# Patient Record
Sex: Female | Born: 1935 | Hispanic: No | Smoking: Former smoker
Health system: Southern US, Community
[De-identification: ages and names within clinical notes are randomized; demographics above are authoritative.]

## PROBLEM LIST (undated history)

## (undated) DIAGNOSIS — T4145XA Adverse effect of unspecified anesthetic, initial encounter: Secondary | ICD-10-CM

## (undated) DIAGNOSIS — C801 Malignant (primary) neoplasm, unspecified: Secondary | ICD-10-CM

## (undated) DIAGNOSIS — Z923 Personal history of irradiation: Secondary | ICD-10-CM

## (undated) DIAGNOSIS — D649 Anemia, unspecified: Secondary | ICD-10-CM

## (undated) DIAGNOSIS — T8859XA Other complications of anesthesia, initial encounter: Secondary | ICD-10-CM

## (undated) HISTORY — PX: TONSILLECTOMY: SUR1361

## (undated) HISTORY — PX: BREAST LUMPECTOMY: SHX2

## (undated) HISTORY — DX: Personal history of irradiation: Z92.3

---

## 1959-01-10 DIAGNOSIS — D649 Anemia, unspecified: Secondary | ICD-10-CM

## 1959-01-10 HISTORY — DX: Anemia, unspecified: D64.9

## 1959-01-10 HISTORY — PX: APPENDECTOMY: SHX54

## 1999-08-06 ENCOUNTER — Encounter: Payer: Self-pay | Admitting: Obstetrics and Gynecology

## 1999-08-06 ENCOUNTER — Encounter: Admission: RE | Admit: 1999-08-06 | Discharge: 1999-08-06 | Payer: Self-pay | Admitting: Obstetrics and Gynecology

## 2000-09-06 ENCOUNTER — Encounter: Payer: Self-pay | Admitting: Obstetrics and Gynecology

## 2000-09-06 ENCOUNTER — Encounter: Admission: RE | Admit: 2000-09-06 | Discharge: 2000-09-06 | Payer: Self-pay | Admitting: Obstetrics and Gynecology

## 2001-09-09 ENCOUNTER — Encounter: Admission: RE | Admit: 2001-09-09 | Discharge: 2001-09-09 | Payer: Self-pay | Admitting: Obstetrics and Gynecology

## 2001-09-09 ENCOUNTER — Encounter: Payer: Self-pay | Admitting: Obstetrics and Gynecology

## 2002-09-25 ENCOUNTER — Encounter: Payer: Self-pay | Admitting: Obstetrics and Gynecology

## 2002-09-25 ENCOUNTER — Encounter: Admission: RE | Admit: 2002-09-25 | Discharge: 2002-09-25 | Payer: Self-pay | Admitting: Obstetrics and Gynecology

## 2003-10-01 ENCOUNTER — Encounter: Admission: RE | Admit: 2003-10-01 | Discharge: 2003-10-01 | Payer: Self-pay | Admitting: Obstetrics and Gynecology

## 2003-11-08 ENCOUNTER — Ambulatory Visit (HOSPITAL_COMMUNITY): Admission: RE | Admit: 2003-11-08 | Discharge: 2003-11-08 | Payer: Self-pay | Admitting: Family Medicine

## 2004-10-16 ENCOUNTER — Encounter: Admission: RE | Admit: 2004-10-16 | Discharge: 2004-10-16 | Payer: Self-pay | Admitting: Obstetrics and Gynecology

## 2005-10-26 ENCOUNTER — Encounter: Admission: RE | Admit: 2005-10-26 | Discharge: 2005-10-26 | Payer: Self-pay | Admitting: Obstetrics and Gynecology

## 2006-11-04 ENCOUNTER — Encounter: Admission: RE | Admit: 2006-11-04 | Discharge: 2006-11-04 | Payer: Self-pay | Admitting: Obstetrics and Gynecology

## 2007-05-12 HISTORY — PX: CATARACT EXTRACTION: SUR2

## 2007-12-01 ENCOUNTER — Encounter: Admission: RE | Admit: 2007-12-01 | Discharge: 2007-12-01 | Payer: Self-pay | Admitting: Obstetrics and Gynecology

## 2008-12-21 ENCOUNTER — Encounter: Admission: RE | Admit: 2008-12-21 | Discharge: 2008-12-21 | Payer: Self-pay | Admitting: Family Medicine

## 2010-03-24 ENCOUNTER — Encounter: Admission: RE | Admit: 2010-03-24 | Discharge: 2010-03-24 | Payer: Self-pay | Admitting: Family Medicine

## 2013-01-25 ENCOUNTER — Other Ambulatory Visit: Payer: Self-pay | Admitting: Obstetrics & Gynecology

## 2013-01-27 ENCOUNTER — Encounter (HOSPITAL_COMMUNITY)
Admission: RE | Admit: 2013-01-27 | Discharge: 2013-01-27 | Disposition: A | Discharge status: Home / Self Care | Payer: Medicare Other | Source: Ambulatory Visit | Attending: Obstetrics & Gynecology | Admitting: Obstetrics & Gynecology

## 2013-01-27 ENCOUNTER — Encounter (HOSPITAL_COMMUNITY): Payer: Self-pay

## 2013-01-27 DIAGNOSIS — Z01818 Encounter for other preprocedural examination: Secondary | ICD-10-CM | POA: Insufficient documentation

## 2013-01-27 DIAGNOSIS — Z01812 Encounter for preprocedural laboratory examination: Secondary | ICD-10-CM | POA: Insufficient documentation

## 2013-01-27 HISTORY — DX: Adverse effect of unspecified anesthetic, initial encounter: T41.45XA

## 2013-01-27 HISTORY — DX: Other complications of anesthesia, initial encounter: T88.59XA

## 2013-01-27 LAB — CBC
Hemoglobin: 14.6 g/dL (ref 12.0–15.0)
MCH: 30.5 pg (ref 26.0–34.0)
MCV: 86.8 fL (ref 78.0–100.0)
RBC: 4.79 MIL/uL (ref 3.87–5.11)
WBC: 7.8 10*3/uL (ref 4.0–10.5)

## 2013-01-27 NOTE — Pre-Procedure Instructions (Signed)
EKG (normal) of 02/08/12 accepted by Dr Brayton Caves and Dr Rodman Pickle.

## 2013-01-27 NOTE — Patient Instructions (Addendum)
20 Ariya Bohannon [place]ridge  01/27/2013   Your procedure is scheduled on:  02/03/13  Enter through the Main Entrance of [facility] at 10 AM.  Pick up the phone at the desk and dial [phone].   Call this number if you have problems the morning of surgery: [phone]   Remember:   Do not eat food:After Midnight.  Do not drink clear liquids: After Midnight.  Take these medicines the morning of surgery with A SIP OF WATER: NA   Do not wear jewelry, make-up or nail polish.  Do not wear lotions, powders, or perfumes. You may wear deodorant.  Do not shave 48 hours prior to surgery.  Do not bring valuables to the hospital.  [facility] is not   responsible for any belongings or valuables brought to the hospital.  Contacts, dentures or bridgework may not be worn into surgery.  Leave suitcase in the car. After surgery it may be brought to your room.  For patients admitted to the hospital, checkout time is 11:00 AM the day of              discharge.   Patients discharged the day of surgery will not be allowed to drive             home.  Name and phone number of your driver: undecided  Special Instructions:   Shower using CHG 2 nights before surgery and the night before surgery.  If you shower the day of surgery use CHG.  Use special wash - you have one bottle of CHG for all showers.  You should use approximately 1/3 of the bottle for each shower.   Please read over the following fact sheets that you were given:   Surgical Site Infection Prevention

## 2013-02-02 ENCOUNTER — Encounter (HOSPITAL_COMMUNITY): Payer: Self-pay | Admitting: Anesthesiology

## 2013-02-02 ENCOUNTER — Ambulatory Visit (HOSPITAL_COMMUNITY)
Admission: RE | Admit: 2013-02-02 | Discharge: 2013-02-02 | Disposition: A | Discharge status: Home / Self Care | Payer: Medicare Other | Source: Ambulatory Visit | Attending: Obstetrics & Gynecology | Admitting: Obstetrics & Gynecology

## 2013-02-02 ENCOUNTER — Encounter (HOSPITAL_COMMUNITY)
Admission: RE | Disposition: A | Discharge status: Home / Self Care | Payer: Self-pay | Source: Ambulatory Visit | Attending: Obstetrics & Gynecology

## 2013-02-02 ENCOUNTER — Ambulatory Visit (HOSPITAL_COMMUNITY): Payer: Medicare Other | Admitting: Anesthesiology

## 2013-02-02 DIAGNOSIS — N840 Polyp of corpus uteri: Secondary | ICD-10-CM | POA: Insufficient documentation

## 2013-02-02 DIAGNOSIS — N859 Noninflammatory disorder of uterus, unspecified: Secondary | ICD-10-CM | POA: Insufficient documentation

## 2013-02-02 DIAGNOSIS — N950 Postmenopausal bleeding: Secondary | ICD-10-CM | POA: Insufficient documentation

## 2013-02-02 HISTORY — PX: DILITATION & CURRETTAGE/HYSTROSCOPY WITH VERSAPOINT RESECTION: SHX5571

## 2013-02-02 LAB — COMPREHENSIVE METABOLIC PANEL
Alkaline Phosphatase: 82 U/L (ref 39–117)
BUN: 20 mg/dL (ref 6–23)
GFR calc Af Amer: 57 mL/min — ABNORMAL LOW (ref >90)
Glucose, Bld: 100 mg/dL — ABNORMAL HIGH (ref 70–99)
Potassium: 3.8 mEq/L (ref 3.5–5.1)
Total Bilirubin: 0.5 mg/dL (ref 0.3–1.2)
Total Protein: 7 g/dL (ref 6.0–8.3)

## 2013-02-02 SURGERY — DILATATION & CURETTAGE/HYSTEROSCOPY WITH VERSAPOINT RESECTION
Anesthesia: General | Site: Vagina | Wound class: Clean Contaminated

## 2013-02-02 MED ORDER — FENTANYL CITRATE 0.05 MG/ML IJ SOLN: 25.0000 ug | INTRAMUSCULAR | Status: DC | PRN

## 2013-02-02 MED ORDER — CEFAZOLIN SODIUM-DEXTROSE 2-3 GM-% IV SOLR
INTRAVENOUS | Status: AC
  Filled 2013-02-02: qty 50

## 2013-02-02 MED ORDER — LIDOCAINE HCL (CARDIAC) 20 MG/ML IV SOLN
INTRAVENOUS | Status: DC | PRN
  Administered 2013-02-02: 30 mL via INTRAVENOUS

## 2013-02-02 MED ORDER — CHLOROPROCAINE HCL 1 % IJ SOLN
INTRAMUSCULAR | Status: AC
  Filled 2013-02-02: qty 30

## 2013-02-02 MED ORDER — ONDANSETRON HCL 4 MG/2ML IJ SOLN: 4.0000 mg | Freq: Once | INTRAMUSCULAR | Status: DC | PRN

## 2013-02-02 MED ORDER — LACTATED RINGERS IV SOLN
INTRAVENOUS | Status: DC
  Administered 2013-02-02: 07:00:00 via INTRAVENOUS

## 2013-02-02 MED ORDER — GLYCOPYRROLATE 0.2 MG/ML IJ SOLN
INTRAMUSCULAR | Status: DC | PRN
  Administered 2013-02-02: 0.2 mg via INTRAVENOUS

## 2013-02-02 MED ORDER — GLYCOPYRROLATE 0.2 MG/ML IJ SOLN
INTRAMUSCULAR | Status: AC
  Filled 2013-02-02: qty 1

## 2013-02-02 MED ORDER — DIPHENHYDRAMINE HCL 50 MG/ML IJ SOLN
INTRAMUSCULAR | Status: AC
  Filled 2013-02-02: qty 1

## 2013-02-02 MED ORDER — EPHEDRINE SULFATE 50 MG/ML IJ SOLN
INTRAMUSCULAR | Status: AC
  Filled 2013-02-02: qty 1

## 2013-02-02 MED ORDER — CEFAZOLIN SODIUM-DEXTROSE 2-3 GM-% IV SOLR
2.0000 g | INTRAVENOUS | Status: AC
  Administered 2013-02-02: 2 g via INTRAVENOUS

## 2013-02-02 MED ORDER — KETOROLAC TROMETHAMINE 30 MG/ML IJ SOLN: 15.0000 mg | Freq: Once | INTRAMUSCULAR | Status: DC | PRN

## 2013-02-02 MED ORDER — HYDROCODONE-IBUPROFEN 5-200 MG PO TABS: 1.0000 | ORAL_TABLET | Freq: Three times a day (TID) | ORAL | Status: DC | PRN

## 2013-02-02 MED ORDER — FENTANYL CITRATE 0.05 MG/ML IJ SOLN
INTRAMUSCULAR | Status: AC
  Filled 2013-02-02: qty 2

## 2013-02-02 MED ORDER — FENTANYL CITRATE 0.05 MG/ML IJ SOLN
INTRAMUSCULAR | Status: DC | PRN
  Administered 2013-02-02 (×2): 25 ug via INTRAVENOUS
  Administered 2013-02-02: 12.5 ug via INTRAVENOUS

## 2013-02-02 MED ORDER — DIPHENHYDRAMINE HCL 50 MG/ML IJ SOLN
INTRAMUSCULAR | Status: DC | PRN
  Administered 2013-02-02: 12.5 mg via INTRAVENOUS

## 2013-02-02 MED ORDER — CHLOROPROCAINE HCL 1 % IJ SOLN
INTRAMUSCULAR | Status: DC | PRN
  Administered 2013-02-02: 20 mL

## 2013-02-02 MED ORDER — PROPOFOL 10 MG/ML IV BOLUS
INTRAVENOUS | Status: DC | PRN
  Administered 2013-02-02: 80 mg via INTRAVENOUS
  Administered 2013-02-02: 20 mg via INTRAVENOUS

## 2013-02-02 MED ORDER — MEPERIDINE HCL 25 MG/ML IJ SOLN: 6.2500 mg | INTRAMUSCULAR | Status: DC | PRN

## 2013-02-02 MED ORDER — SODIUM CHLORIDE 0.9 % IR SOLN
Status: DC | PRN
  Administered 2013-02-02 (×2): 3000 mL

## 2013-02-02 SURGICAL SUPPLY — 15 items
CANISTER SUCTION 2500CC (MISCELLANEOUS) ×2 IMPLANT
CATH ROBINSON RED A/P 16FR (CATHETERS) ×2 IMPLANT
CLOTH BEACON ORANGE TIMEOUT ST (SAFETY) ×2 IMPLANT
CONTAINER PREFILL 10% NBF 60ML (FORM) ×2 IMPLANT
DRESSING TELFA 8X3 (GAUZE/BANDAGES/DRESSINGS) ×2 IMPLANT
ELECTRODE ROLLER VERSAPOINT (ELECTRODE) ×2 IMPLANT
ELECTRODE RT ANGLE VERSAPOINT (CUTTING LOOP) IMPLANT
GLOVE BIO SURGEON STRL SZ 6.5 (GLOVE) ×2 IMPLANT
GLOVE BIOGEL PI IND STRL 7.0 (GLOVE) ×2 IMPLANT
GLOVE BIOGEL PI INDICATOR 7.0 (GLOVE) ×2
GOWN STRL REIN XL XLG (GOWN DISPOSABLE) ×4 IMPLANT
PACK HYSTEROSCOPY LF (CUSTOM PROCEDURE TRAY) ×2 IMPLANT
PAD OB MATERNITY 4.3X12.25 (PERSONAL CARE ITEMS) ×2 IMPLANT
TOWEL OR 17X24 6PK STRL BLUE (TOWEL DISPOSABLE) ×4 IMPLANT
WATER STERILE IRR 1000ML POUR (IV SOLUTION) ×2 IMPLANT

## 2013-02-02 NOTE — Transfer of Care (Signed)
Immediate Anesthesia Transfer of Care Note  Patient: Jasmine Cooper  Procedure(s) Performed: Procedure(s): DILATATION & CURETTAGE/HYSTEROSCOPY WITH VERSAPOINT RESECTION (N/A)  Patient Location: PACU  Anesthesia Type:General  Level of Consciousness: awake, sedated and patient cooperative  Airway & Oxygen Therapy: Patient Spontanous Breathing and Patient connected to nasal cannula oxygen  Post-op Assessment: Report given to PACU RN and Post -op Vital signs reviewed and stable  Post vital signs: Reviewed and stable  Complications: No apparent anesthesia complications

## 2013-02-02 NOTE — Anesthesia Preprocedure Evaluation (Addendum)
Anesthesia Evaluation  Patient identified by MRN, date of birth, ID band Patient awake    Reviewed: Allergy & Precautions, H&P , Patient's Chart, lab work & pertinent test results, reviewed documented beta blocker date and time   Airway Mallampati: II TM Distance: >3 FB Neck ROM: full    Dental no notable dental hx.    Pulmonary  breath sounds clear to auscultation  Pulmonary exam normal       Cardiovascular Rhythm:regular Rate:Normal     Neuro/Psych    GI/Hepatic   Endo/Other    Renal/GU      Musculoskeletal   Abdominal   Peds  Hematology   Anesthesia Other Findings   Reproductive/Obstetrics                           Anesthesia Physical Anesthesia Plan  ASA: II  Anesthesia Plan:    Post-op Pain Management:    Induction: Intravenous  Airway Management Planned: LMA  Additional Equipment:   Intra-op Plan:   Post-operative Plan:   Informed Consent: I have reviewed the patients History and Physical, chart, labs and discussed the procedure including the risks, benefits and alternatives for the proposed anesthesia with the patient or authorized representative who has indicated his/her understanding and acceptance.   Dental Advisory Given and Dental advisory given  Plan Discussed with: CRNA and Surgeon  Anesthesia Plan Comments:         Anesthesia Quick Evaluation

## 2013-02-02 NOTE — Op Note (Addendum)
02/02/2013  8:53 AM  PATIENT:  Jasmine Cooper  79 y.o. female  PRE-OPERATIVE DIAGNOSIS:  Postmenopausal Bleeding, Metaplasia    POST-OPERATIVE DIAGNOSIS:  Postmenopausal Bleeding, Metaplasia                                                         Multipolypoid, vascular intrauterine lesion   PROCEDURE:  Procedure(s): DILATATION & CURETTAGE/HYSTEROSCOPY WITH VERSAPOINT RESECTION  SURGEON:  Surgeon(s): Genia Del, MD  ASSISTANTS: none   ANESTHESIA:   general  PROCEDURE:  Under general anesthesia with laryngeal mask the patient is in lithotomy position. She is a prepped with Betadine on the suprapubic vulvar and vaginal areas. She is draped as usual. The bladder is catheterized. The vaginal exam reveals an anteverted uterus normal volume and no adnexal mass. The speculum was introduced in the vagina. The anterior lip of the cervix is grasped with a tenaculum. Dilation of the cervix with Hegar dilators up to #27 without difficulty. Diagnostic hysteroscopy performed revealing mostlyan atrophic endometrium with both ostia visualized, but on the anterior lower aspect of the intrauterine cavity, towards the left side, a multi-polypoid vascular lesion is present.  The diagnostic hysteroscope was removed. We further dilate the cervix with Hegar dilators up to #33. We then used the operative hysteroscope with the VersaPoint loop to resect that lesion. We then proceed with a systematic thorough curettage of the intrauterine cavity on all surfaces. Both specimens are sent to pathology. We then go back with the hysteroscope and complete hemostasis with coag mode. Pictures were taken before and after resection. We then removed all instruments.  We confirmed good hemostasis on the cervix and removed the speculum. The patient is brought to recovery room in good and stable status.  ESTIMATED BLOOD LOSS: 50 cc  Fluid deficit 550 cc   Intake/Output Summary (Last 24 hours) at 02/02/13 0853 Last  data filed at 02/02/13 0845  Gross per 24 hour  Intake    900 ml  Output    203 ml  Net    697 ml     BLOOD ADMINISTERED:none   LOCAL MEDICATIONS USED: Nesacaine 1%,  Amount: 20 ml  SPECIMEN:  Source of Specimen:  Endometrial curettings and resection  DISPOSITION OF SPECIMEN:  PATHOLOGY  COUNTS:  YES  PLAN OF CARE: Transfer to PACU  Genia Del MD  02/02/2013 at 8:53 am

## 2013-02-02 NOTE — Discharge Summary (Signed)
  Physician Discharge Summary  Patient ID: Jasmine Cooper MRN: [id number] DOB/AGE: December 31, 1929 79 y.o.  Admit date: 02/02/2013 Discharge date: 02/02/2013  Admission Diagnoses: Postmenopausal Bleeding, Atypical Metaplasia  84696  Discharge Diagnoses: Postmenopausal Bleeding, Atypical Metaplasia  [id number]        Active Problems:   * No active hospital problems. *   Discharged Condition: good  Hospital Course: Outpatient  Consults: None  Treatments: surgery: Hysteroscopy with resection, D+C  Disposition: Final discharge disposition not confirmed     Medication List         hydrocodone-ibuprofen 5-200 MG per tablet  Commonly known as:  VICOPROFEN  Take 1 tablet by mouth every 8 (eight) hours as needed for pain.           Follow-up Information   Follow up with Jasmine Cooper,MARIE-LYNE, MD In 1 week.   Specialty:  Obstetrics and Gynecology   Contact information:   [address] [place] [address] [phone]       Signed: Genia Del, MD 02/02/2013, 9:03 AM

## 2013-02-02 NOTE — Anesthesia Postprocedure Evaluation (Signed)
Anesthesia Post Note  Patient: Jasmine Cooper  Procedure(s) Performed: Procedure(s) (LRB): DILATATION & CURETTAGE/HYSTEROSCOPY WITH VERSAPOINT RESECTION (N/A)  Anesthesia type: General  Patient location: PACU  Post pain: Pain level controlled  Post assessment: Post-op Vital signs reviewed  Last Vitals:  Filed Vitals:   02/02/13 0900  BP: 126/56  Pulse: 52  Temp: 36.7 C  Resp: 20    Post vital signs: Reviewed  Level of consciousness: sedated  Complications: No apparent anesthesia complications

## 2013-02-02 NOTE — H&P (Signed)
Jasmine Cooper is an 79 y.o. female G29P4  RP:  PMB, EBx metaplasia  Pertinent Gynecological History: Menses: post-menopausal Bleeding: post menopausal bleeding Contraception: none Blood transfusions: none Sexually transmitted diseases: no past history Previous GYN Procedures: None  Last mammogram: normal Last pap: normal  OB History: G4P4   Menstrual History:  No LMP recorded.    Past Medical History  Diagnosis Date  . Complication of anesthesia     very sensitive to medication. hard to wake after anes several  yrs ago.  . Medical history non-contributory     Past Surgical History  Procedure Laterality Date  . Appendectomy    . Breast lumpectomy Left     benign cyst    No family history on file.  Social History:  reports that she has quit smoking. She does not have any smokeless tobacco history on file. She reports that she drinks about 0.6 ounces of alcohol per week. She reports that she does not use illicit drugs.  Allergies:  Allergies  Allergen Reactions  . Codeine Nausea And Vomiting  . Morphine And Related Nausea And Vomiting    No prescriptions prior to admission     Blood pressure 142/100, pulse 70, temperature 97.9 F (36.6 C), temperature source Oral, resp. rate 16, SpO2 99.00%.  Pelvic US Thickened endometrium EBx metaplasia  Results for orders placed during the hospital encounter of 02/02/13 (from the past 24 hour(s))  COMPREHENSIVE METABOLIC PANEL     Status: Abnormal   Collection Time    02/02/13  6:15 AM      Result Value Range   Sodium 140  135 - 145 mEq/L   Potassium 3.8  3.5 - 5.1 mEq/L   Chloride 104  96 - 112 mEq/L   CO2 25  19 - 32 mEq/L   Glucose, Bld 100 (*) 70 - 99 mg/dL   BUN 20  6 - 23 mg/dL   Creatinine, Ser 4.09  0.50 - 1.10 mg/dL   Calcium 9.9  8.4 - 81.1 mg/dL   Total Protein 7.0  6.0 - 8.3 g/dL   Albumin 3.6  3.5 - 5.2 g/dL   AST 23  0 - 37 U/L   ALT 16  0 - 35 U/L   Alkaline Phosphatase 82  39 - 117 U/L   Total Bilirubin 0.5  0.3 - 1.2 mg/dL   GFR calc non Af Amer 50 (*) >90 mL/min   GFR calc Af Amer 57 (*) >90 mL/min    No results found.  Assessment/Plan: PMB with metaplasia on EBx, HSC possible resection, D+C.  Surgery and risks reviewed.  Marixa Mellott,MARIE-LYNE 02/02/2013, 7:21 AM

## 2013-02-04 ENCOUNTER — Encounter (HOSPITAL_COMMUNITY): Payer: Self-pay | Admitting: Obstetrics & Gynecology

## 2013-02-07 ENCOUNTER — Telehealth: Payer: Self-pay | Admitting: Gynecologic Oncology

## 2013-02-07 ENCOUNTER — Encounter: Payer: Self-pay | Admitting: Gynecologic Oncology

## 2013-02-07 ENCOUNTER — Ambulatory Visit: Payer: Medicare Other | Attending: Gynecologic Oncology | Admitting: Gynecologic Oncology

## 2013-02-07 VITALS — BP 162/90 | HR 69 | Temp 98.0°F | Resp 16 | Ht 62.4 in | Wt 145.9 lb

## 2013-02-07 DIAGNOSIS — C549 Malignant neoplasm of corpus uteri, unspecified: Secondary | ICD-10-CM | POA: Insufficient documentation

## 2013-02-07 DIAGNOSIS — C541 Malignant neoplasm of endometrium: Secondary | ICD-10-CM

## 2013-02-07 NOTE — Progress Notes (Signed)
Consult Note: Gyn-Onc  Consult was requested by Dr. LaVoie for the evaluation of Jasmine Cooper 79 y.o. female  CC: Endometrial squamous cell cancer.  Assessment/Plan:  This is a lovely 79-year-old who is much younger than her chronological age with an endometrial squamous cell cancer. On physical examination no lesions are identified within the cervix or vagina. On evaluation of the hysteroscopic operative report a lesion in the uterus was clearly identified. This patient is adamant that she would not accept chemotherapy but however would consider radiotherapy. The options presented for management of this presumed uterine squamous cell cancer were either open or minimally invasive hysterectomy with a minimal pelvic lymph node dissection.  There is minimal uterine descensus.  Patient opted for an opened staging procedure and as noted before by Dr. Daniel Clarke Pearson on 02/14/2013 the risks of the procedure discussed with the patient were that of infection bleeding damage to surrounding structures prolonged hospitalization, reoperation, deep venous thrombosis and embolism.   History of present illness: Jasmine Cooper is a 79 y.o. year old G4P4 who presented to Dr. LaVoie with c/o post menopausal bleeding since July 2014. She reports intentional weight loss over the last 2-3 months of approximately 3-4 pounds. Pelvic UTZ was c/w a thickened endometrial stripe measuring 24.4 mm. Uterine length is 6.5 cm the adnexa were without any identifiable abnormalities. An endometrial biopsy was attempted on 01/16/2013 returned evidence of atypical squamous metaplasia Hysteroscopy D&C performed 02/02/2013 by Dr. LaVoie. Findings notable for"on the anterior lower aspect of the intrauterine cavity, towards the left side, a multi-polypoid vascular lesion is present". No mention was made of an abnormal appearing cervix. Endometrial biopsy c/w SCCA.     Current Meds:  Outpatient Encounter Prescriptions as of  02/07/2013  Medication Sig Dispense Refill  . hydrocodone-ibuprofen (VICOPROFEN) 5-200 MG per tablet Take 1 tablet by mouth every 8 (eight) hours as needed for pain.  30 tablet  0   No facility-administered encounter medications on file as of 02/07/2013.    Allergy:  Allergies  Allergen Reactions  . Codeine Nausea And Vomiting  . Morphine And Related Nausea And Vomiting    Social Hx:   History   Social History  . Marital Status: Widowed    Spouse Name: N/A    Number of Children: N/A  . Years of Education: N/A   Occupational History  . Not on file.   Social History Main Topics  . Smoking status: Former Smoker  . Smokeless tobacco: Not on file  . Alcohol Use: 0.6 oz/week    1 Glasses of wine per week     Comment: rarely  . Drug Use: No  . Sexual Activity: Not on file   Other Topics Concern  . Not on file   Social History Narrative  . No narrative on file    Past Surgical Hx:  Past Surgical History  Procedure Laterality Date  . Appendectomy    . Breast lumpectomy Left     benign cyst  . Dilitation & currettage/hystroscopy with versapoint resection N/A 02/02/2013    Procedure: DILATATION & CURETTAGE/HYSTEROSCOPY WITH VERSAPOINT RESECTION;  Surgeon: Marie-Lyne Lavoie, MD;  Location: [facility];  Service: Gynecology;  Laterality: N/A;  . Cataract extraction  2009   Jasmine Cooper  is widowed and has 2 sons who reside in the area. Her daughters reside in [place] and plan to travel back to the area for the operative procedure.  The patient performs all her of her activities of daily living and   uses a Smart phone. She is desirous of a more up to date and current model.  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 Complication of anesthesia     very sensitive to medication. hard to wake after anes several  yrs ago.  . Medical history non-contributory     Past Gynecological History:  Gravida 4 para  4 for normal spontaneous vaginal deliveries menarche at age 14 with regular menses until her early 50s. Denies any history of abnormal Pap tests.  Family Hx:  Family History  Problem Relation Age of Onset  . Cancer Maternal Grandmother   . Breast cancer Daughter     Review of Systems:  Constitutional  Feels well,  Cardiovascular  No chest pain, shortness of breath, or edema  Pulmonary  No cough or wheeze.  Gastro Intestinal  No nausea, vomitting, or diarrhoea. No bright red blood per rectum, no abdominal pain, change in bowel movement, or constipation.  Genito Urinary  No frequency, urgency, dysuria, intermittent vaginal spotting no vaginal discharge no incontinence Musculo Skeletal  No myalgia, arthralgia, joint swelling or pain  Neurologic  No weakness, numbness, change in gait,  Psychology  No depression, anxiety, insomnia. Normal mood and affect appropriate judgment  Vitals:  Blood pressure 162/90, pulse 69, temperature 98 F (36.7 C), resp. rate 16, height 5' 2.4" (1.585 m), weight 145 lb 14.4 oz (66.18 kg).  Physical Exam: WD in NAD appears and acts much younger than her chronological age Neck  Supple NROM, without any enlargements.  Lymph Node Survey No cervical supraclavicular or inguinal adenopathy Cardiovascular  Pulse normal rate, regularity and rhythm. S1 and S2 normal.  Lungs  Clear to auscultation bilateraly, without wheezes/crackles/rhonchi. Good air movement.  Skin  No rash/lesions/breakdown  Psychiatry  Alert and oriented to person, place, and time  Abdomen  Normoactive bowel sounds, abdomen soft, non-tender and obese. Old appendectomy scar without evidence of hernia. No fluid wave no palpable omental cake Back No CVA tenderness Genito Urinary  Vulva/vagina: Normal external female genitalia.  No lesions. No discharge or bleeding.  Bladder/urethra:  No lesions or masses  Vagina: Atrophic without any lesions or masses  Cervix: Normal appearing, no  lesions. Good vaginal support  Uterus: Small, mobile, no parametrial involvement or nodularity.  Adnexa: No palpable masses. No cul de sac nodularity. Rectal  Good tone, no masses no cul de sac nodularity.  Extremities  No bilateral cyanosis, clubbing or edema.   Jasmine Hayne, MD, PhD 02/07/2013, 12:35 PM    

## 2013-02-07 NOTE — Telephone Encounter (Signed)
Consult Note: Gyn-Onc  Consult was requested by Dr. Seymour Bars for the evaluation of Jasmine Cooper 79 y.o. female  CC: SCCA of the endometruium.  Assessment/Plan:  Jasmine Cooper [id number]  is a 79 y.o.    HPI:  Ms.  Cooper is a 79 y.o. year old  G4P4 who presented to Dr. Seymour Bars with c/o post menopausal bleeding.  Pelvic UTZ was c/w a thickened endometrial stripe.  Hysteroscopy D&C performed 02/02/2013 by Dr. Seymour Bars.  Findings notable for"on the anterior lower aspect of the intrauterine cavity, towards the left side, a multi-polypoid vascular lesion is present".  No mention was made of an abnormal appearing cervix.  Endometrial biopsy c/w SCCA.    Interval History:    Current Meds:  Outpatient Encounter Prescriptions as of 02/07/2013  Medication Sig Dispense Refill  . hydrocodone-ibuprofen (VICOPROFEN) 5-200 MG per tablet Take 1 tablet by mouth every 8 (eight) hours as needed for pain.  30 tablet  0   No facility-administered encounter medications on file as of 02/07/2013.    Allergy:  Allergies  Allergen Reactions  . Codeine Nausea And Vomiting  . Morphine And Related Nausea And Vomiting    Social Hx:   History   Social History  . Marital Status: Widowed    Spouse Name: N/A    Number of Children: N/A  . Years of Education: N/A   Occupational History  . Not on file.   Social History Main Topics  . Smoking status: Former Games developer  . Smokeless tobacco: Not on file  . Alcohol Use: 0.6 oz/week    1 Glasses of wine per week     Comment: rarely  . Drug Use: No  . Sexual Activity: Not on file   Other Topics Concern  . Not on file   Social History Narrative  . No narrative on file    Past Surgical Hx:  Past Surgical History  Procedure Laterality Date  . Appendectomy    . Breast lumpectomy Left     benign cyst  . Dilitation & currettage/hystroscopy with versapoint resection N/A 02/02/2013    Procedure: DILATATION & CURETTAGE/HYSTEROSCOPY WITH VERSAPOINT  RESECTION;  Surgeon: Genia Del, MD;  Location: [facility];  Service: Gynecology;  Laterality: N/A;    Past Medical Hx:  Past Medical History  Diagnosis Date  . Complication of anesthesia     very sensitive to medication. hard to wake after anes several  yrs ago.  . Medical history non-contributory     Past Gynecological History:     No LMP recorded. Patient is postmenopausal.  Family Hx: No family history on file.  Review of Systems:  Constitutional  Feels well,    Skin/Breast  No rash, sores, jaundice, itching, dryness Cardiovascular  No chest pain, shortness of breath, or edema  Pulmonary  No cough or wheeze.  Gastro Intestinal  No nausea, vomitting, or diarrhoea. No bright red blood per rectum, no abdominal pain, change in bowel movement, or constipation.  Genito Urinary  No frequency, urgency, dysuria,   Musculo Skeletal  No myalgia, arthralgia, joint swelling or pain  Neurologic  No weakness, numbness, change in gait,  Psychology  No depression, anxiety, insomnia.   Vitals:   Physical Exam: WD in NAD Neck  Supple NROM, without any enlargements.  Lymph Node Survey No cervical supraclavicular or inguinal adenopathy Cardiovascular  Pulse normal rate, regularity and rhythm. S1 and S2 normal.  Lungs  Clear to auscultation bilateraly, without wheezes/crackles/rhonchi. Good air movement.  Skin  No rash/lesions/breakdown  Psychiatry  Alert and oriented to person, place, and time  Abdomen  Normoactive bowel sounds, abdomen soft, non-tender and obese. Surgical  sites intact without evidence of hernia.  Back No CVA tenderness Genito Urinary  Vulva/vagina: Normal external female genitalia.  No lesions. No discharge or bleeding.  Bladder/urethra:  No lesions or masses  Vagina:   Cervix: Normal appearing, no lesions.  Uterus: Small, mobile, no parametrial involvement or nodularity.  Adnexa: No palpable masses. Rectal  Good tone, no masses no cul de sac  nodularity.  Extremities  No bilateral cyanosis, clubbing or edema.   Laurette Schimke, MD, PhD 02/07/2013, 7:31 AM

## 2013-02-08 NOTE — Patient Instructions (Addendum)
20 Jasmine Cooper [id number]  02/08/2013   Your procedure is scheduled on: 02-14-2013  Report to [facility] at 745 AM.  Call this number if you have problems the morning of surgery [phone]   Remember:fleets enema evening Monday 02-13-2013   Do not eat food  :After Midnight Sunday night.              Clear liquids all day Monday 02-13-2013, no liquids after midnight Monday night.      Take these medicines the morning of surgery with A SIP OF WATER: none                                SEE [place] PREPARING FOR SURGERY SHEET             You may not have any metal on your body including hair pins and piercings  Do not wear jewelry, make-up.  Do not wear lotions, powders, or perfumes. You may wear deodorant.   Men may shave face and neck.  Do not bring valuables to the hospital. [facility] IS NOT RESPONSIBLE FOR VALUEABLES.  Contacts, dentures or bridgework may not be worn into surgery.  Leave suitcase in the car. After surgery it may be brought to your room.  For patients admitted to the hospital, checkout time is 11:00 AM the day of discharge.   Patients discharged the day of surgery will not be allowed to drive home.  Name and phone number of your driver:  Special Instructions: N/A   Please read over the following fact sheets that you were given: incentive spirometer sheet, clear liquids sheet, blood fact sheet  Call Cain Sieve RN pre op nurse if needed [phone]    FAILURE TO FOLLOW THESE INSTRUCTIONS MAY RESULT IN THE CANCELLATION OF YOUR SURGERY.  PATIENT SIGNATURE___________________________________________  NURSE SIGNATURE_____________________________________________

## 2013-02-09 ENCOUNTER — Other Ambulatory Visit: Payer: Self-pay | Admitting: Gynecologic Oncology

## 2013-02-09 ENCOUNTER — Encounter (HOSPITAL_COMMUNITY)
Admission: RE | Admit: 2013-02-09 | Discharge: 2013-02-09 | Disposition: A | Discharge status: Home / Self Care | Payer: Medicare Other | Source: Ambulatory Visit | Attending: Gynecology | Admitting: Gynecology

## 2013-02-09 ENCOUNTER — Encounter (HOSPITAL_COMMUNITY): Payer: Self-pay | Admitting: Pharmacy Technician

## 2013-02-09 ENCOUNTER — Ambulatory Visit (HOSPITAL_COMMUNITY)
Admission: RE | Admit: 2013-02-09 | Discharge: 2013-02-09 | Disposition: A | Discharge status: Home / Self Care | Payer: Medicare Other | Source: Ambulatory Visit | Attending: Gynecologic Oncology | Admitting: Gynecologic Oncology

## 2013-02-09 ENCOUNTER — Encounter (HOSPITAL_COMMUNITY): Payer: Self-pay

## 2013-02-09 DIAGNOSIS — C549 Malignant neoplasm of corpus uteri, unspecified: Secondary | ICD-10-CM | POA: Insufficient documentation

## 2013-02-09 DIAGNOSIS — Z01812 Encounter for preprocedural laboratory examination: Secondary | ICD-10-CM | POA: Insufficient documentation

## 2013-02-09 DIAGNOSIS — N390 Urinary tract infection, site not specified: Secondary | ICD-10-CM

## 2013-02-09 HISTORY — DX: Malignant (primary) neoplasm, unspecified: C80.1

## 2013-02-09 HISTORY — DX: Anemia, unspecified: D64.9

## 2013-02-09 LAB — CBC WITH DIFFERENTIAL/PLATELET
Eosinophils Absolute: 0.5 10*3/uL (ref 0.0–0.7)
Eosinophils Relative: 6 % — ABNORMAL HIGH (ref 0–5)
HCT: 43.2 % (ref 36.0–46.0)
Lymphocytes Relative: 24 % (ref 12–46)
Lymphs Abs: 1.9 10*3/uL (ref 0.7–4.0)
MCH: 30.1 pg (ref 26.0–34.0)
MCV: 87.8 fL (ref 78.0–100.0)
Monocytes Absolute: 0.6 10*3/uL (ref 0.1–1.0)
Neutro Abs: 4.8 10*3/uL (ref 1.7–7.7)
Platelets: 249 10*3/uL (ref 150–400)
RBC: 4.92 MIL/uL (ref 3.87–5.11)
RDW: 13.1 % (ref 11.5–15.5)
WBC: 7.7 10*3/uL (ref 4.0–10.5)

## 2013-02-09 LAB — COMPREHENSIVE METABOLIC PANEL
ALT: 16 U/L (ref 0–35)
Albumin: 3.7 g/dL (ref 3.5–5.2)
CO2: 27 mEq/L (ref 19–32)
Calcium: 9.7 mg/dL (ref 8.4–10.5)
Chloride: 101 mEq/L (ref 96–112)
Creatinine, Ser: 0.85 mg/dL (ref 0.50–1.10)
GFR calc Af Amer: 72 mL/min — ABNORMAL LOW (ref >90)
GFR calc non Af Amer: 63 mL/min — ABNORMAL LOW (ref >90)
Glucose, Bld: 92 mg/dL (ref 70–99)
Sodium: 139 mEq/L (ref 135–145)
Total Protein: 7.2 g/dL (ref 6.0–8.3)

## 2013-02-09 LAB — URINE MICROSCOPIC-ADD ON

## 2013-02-09 LAB — ABO/RH: ABO/RH(D): A POS

## 2013-02-09 LAB — URINALYSIS, ROUTINE W REFLEX MICROSCOPIC
Bilirubin Urine: NEGATIVE
Ketones, ur: NEGATIVE mg/dL
Nitrite: POSITIVE — AB
Specific Gravity, Urine: 1.015 (ref 1.005–1.030)
pH: 6 (ref 5.0–8.0)

## 2013-02-09 MED ORDER — SULFAMETHOXAZOLE-TMP DS 800-160 MG PO TABS: 1.0000 | ORAL_TABLET | Freq: Two times a day (BID) | ORAL | Status: DC

## 2013-02-09 NOTE — Progress Notes (Signed)
Patient notified of urinalysis results and the need to take Bactrim DS twice daily for three days prior to surgery per Dr. Nelly Rout.  Verbalizing understanding.  Instructed to call for any needs.  Reportable signs and symptoms reviewed.

## 2013-02-09 NOTE — Progress Notes (Signed)
Urine results routed to [facility] cross rn by epic to inbasket

## 2013-02-11 LAB — URINE CULTURE: Colony Count: >=100000

## 2013-02-13 ENCOUNTER — Telehealth: Payer: Self-pay | Admitting: Gynecologic Oncology

## 2013-02-13 NOTE — Telephone Encounter (Signed)
Spoke with patient about pre-operative status.  She has started taking in clear liquids only this am.  Plans to administer fleets enema this evening.  No concerns voiced.  Instructed to call for any needs.  Verbalizing understanding of instructions for surgery tomorrow morning.

## 2013-02-13 NOTE — Progress Notes (Signed)
Urine culture results faxed to [facility] np

## 2013-02-14 ENCOUNTER — Encounter (HOSPITAL_COMMUNITY): Payer: Self-pay | Admitting: *Deleted

## 2013-02-14 ENCOUNTER — Inpatient Hospital Stay (HOSPITAL_COMMUNITY): Payer: Medicare Other | Admitting: Anesthesiology

## 2013-02-14 ENCOUNTER — Encounter (HOSPITAL_COMMUNITY): Payer: Self-pay | Admitting: Anesthesiology

## 2013-02-14 ENCOUNTER — Encounter (HOSPITAL_COMMUNITY)
Admission: RE | Disposition: A | Discharge status: Home / Self Care | Payer: Self-pay | Source: Ambulatory Visit | Attending: Obstetrics & Gynecology

## 2013-02-14 ENCOUNTER — Inpatient Hospital Stay (HOSPITAL_COMMUNITY)
Admission: RE | Admit: 2013-02-14 | Discharge: 2013-02-16 | DRG: 741 | Disposition: A | Discharge status: Home / Self Care | Payer: Medicare Other | Source: Ambulatory Visit | Attending: Obstetrics & Gynecology | Admitting: Obstetrics & Gynecology

## 2013-02-14 DIAGNOSIS — C549 Malignant neoplasm of corpus uteri, unspecified: Secondary | ICD-10-CM

## 2013-02-14 DIAGNOSIS — Z87891 Personal history of nicotine dependence: Secondary | ICD-10-CM

## 2013-02-14 DIAGNOSIS — N950 Postmenopausal bleeding: Secondary | ICD-10-CM | POA: Diagnosis present

## 2013-02-14 DIAGNOSIS — C541 Malignant neoplasm of endometrium: Secondary | ICD-10-CM | POA: Diagnosis present

## 2013-02-14 HISTORY — PX: LAPAROTOMY: SHX154

## 2013-02-14 HISTORY — PX: ABDOMINAL HYSTERECTOMY: SHX81

## 2013-02-14 HISTORY — PX: SALPINGOOPHORECTOMY: SHX82

## 2013-02-14 LAB — TYPE AND SCREEN: Antibody Screen: NEGATIVE

## 2013-02-14 SURGERY — HYSTERECTOMY, ABDOMINAL
Anesthesia: General | Site: Abdomen | Wound class: Clean Contaminated

## 2013-02-14 MED ORDER — HEPARIN SODIUM (PORCINE) 1000 UNIT/ML IJ SOLN
INTRAMUSCULAR | Status: AC
  Filled 2013-02-14: qty 1

## 2013-02-14 MED ORDER — ONDANSETRON HCL 4 MG/2ML IJ SOLN
INTRAMUSCULAR | Status: DC | PRN
  Administered 2013-02-14: 4 mg via INTRAMUSCULAR

## 2013-02-14 MED ORDER — ONDANSETRON HCL 4 MG PO TABS: 4.0000 mg | ORAL_TABLET | Freq: Four times a day (QID) | ORAL | Status: DC | PRN

## 2013-02-14 MED ORDER — ENSURE COMPLETE PO LIQD
237.0000 mL | Freq: Two times a day (BID) | ORAL | Status: DC
  Administered 2013-02-14 – 2013-02-16 (×3): 237 mL via ORAL

## 2013-02-14 MED ORDER — 0.9 % SODIUM CHLORIDE (POUR BTL) OPTIME
TOPICAL | Status: DC | PRN
  Administered 2013-02-14: 2000 mL

## 2013-02-14 MED ORDER — KETOROLAC TROMETHAMINE 30 MG/ML IJ SOLN: 15.0000 mg | Freq: Four times a day (QID) | INTRAMUSCULAR | Status: AC

## 2013-02-14 MED ORDER — KETOROLAC TROMETHAMINE 30 MG/ML IJ SOLN
15.0000 mg | Freq: Four times a day (QID) | INTRAMUSCULAR | Status: AC
  Administered 2013-02-14 – 2013-02-15 (×4): 15 mg via INTRAVENOUS
  Filled 2013-02-14 (×4): qty 1

## 2013-02-14 MED ORDER — MAGNESIUM HYDROXIDE 400 MG/5ML PO SUSP
30.0000 mL | Freq: Three times a day (TID) | ORAL | Status: AC
  Administered 2013-02-14 – 2013-02-15 (×3): 30 mL via ORAL
  Filled 2013-02-14 (×3): qty 30

## 2013-02-14 MED ORDER — GLYCOPYRROLATE 0.2 MG/ML IJ SOLN
INTRAMUSCULAR | Status: DC | PRN
  Administered 2013-02-14: 0.6 mg via INTRAVENOUS
  Administered 2013-02-14: 0.2 mg via INTRAVENOUS

## 2013-02-14 MED ORDER — PHENYLEPHRINE HCL 10 MG/ML IJ SOLN
INTRAMUSCULAR | Status: DC | PRN
  Administered 2013-02-14 (×2): 80 ug via INTRAVENOUS

## 2013-02-14 MED ORDER — HEPARIN SODIUM (PORCINE) 1000 UNIT/ML IJ SOLN
INTRAMUSCULAR | Status: DC | PRN
  Administered 2013-02-14: 1000 [IU]

## 2013-02-14 MED ORDER — KCL IN DEXTROSE-NACL 20-5-0.45 MEQ/L-%-% IV SOLN
INTRAVENOUS | Status: DC
  Administered 2013-02-14: 13:00:00 via INTRAVENOUS
  Filled 2013-02-14 (×2): qty 1000

## 2013-02-14 MED ORDER — ENOXAPARIN SODIUM 40 MG/0.4ML ~~LOC~~ SOLN
40.0000 mg | SUBCUTANEOUS | Status: DC
  Administered 2013-02-15 – 2013-02-16 (×2): 40 mg via SUBCUTANEOUS
  Filled 2013-02-14 (×3): qty 0.4

## 2013-02-14 MED ORDER — ROCURONIUM BROMIDE 100 MG/10ML IV SOLN
INTRAVENOUS | Status: DC | PRN
  Administered 2013-02-14: 40 mg via INTRAVENOUS
  Administered 2013-02-14: 10 mg via INTRAVENOUS

## 2013-02-14 MED ORDER — CEFAZOLIN SODIUM-DEXTROSE 2-3 GM-% IV SOLR
2.0000 g | INTRAVENOUS | Status: AC
  Administered 2013-02-14: 2 g via INTRAVENOUS

## 2013-02-14 MED ORDER — HYDROMORPHONE HCL PF 1 MG/ML IJ SOLN
0.5000 mg | INTRAMUSCULAR | Status: DC | PRN
  Administered 2013-02-14: 0.5 mg via INTRAVENOUS
  Filled 2013-02-14: qty 1

## 2013-02-14 MED ORDER — LIDOCAINE HCL (CARDIAC) 20 MG/ML IV SOLN
INTRAVENOUS | Status: DC | PRN
  Administered 2013-02-14: 100 mg via INTRAVENOUS

## 2013-02-14 MED ORDER — HYDRALAZINE HCL 20 MG/ML IJ SOLN
INTRAMUSCULAR | Status: AC
  Filled 2013-02-14: qty 1

## 2013-02-14 MED ORDER — ONDANSETRON HCL 4 MG/2ML IJ SOLN
4.0000 mg | Freq: Four times a day (QID) | INTRAMUSCULAR | Status: DC | PRN
  Administered 2013-02-14 (×2): 4 mg via INTRAVENOUS
  Filled 2013-02-14 (×2): qty 2

## 2013-02-14 MED ORDER — CEFAZOLIN SODIUM-DEXTROSE 2-3 GM-% IV SOLR
INTRAVENOUS | Status: AC
  Filled 2013-02-14: qty 50

## 2013-02-14 MED ORDER — KCL IN DEXTROSE-NACL 20-5-0.45 MEQ/L-%-% IV SOLN
INTRAVENOUS | Status: AC
  Filled 2013-02-14: qty 1000

## 2013-02-14 MED ORDER — METOCLOPRAMIDE HCL 5 MG/ML IJ SOLN
INTRAMUSCULAR | Status: DC | PRN
  Administered 2013-02-14: 10 mg via INTRAVENOUS

## 2013-02-14 MED ORDER — NEOSTIGMINE METHYLSULFATE 1 MG/ML IJ SOLN
INTRAMUSCULAR | Status: DC | PRN
  Administered 2013-02-14: 5 mg via INTRAVENOUS

## 2013-02-14 MED ORDER — KETAMINE HCL 10 MG/ML IJ SOLN
INTRAMUSCULAR | Status: DC | PRN
  Administered 2013-02-14: 20 mg via INTRAVENOUS

## 2013-02-14 MED ORDER — DEXAMETHASONE SODIUM PHOSPHATE 10 MG/ML IJ SOLN
INTRAMUSCULAR | Status: DC | PRN
  Administered 2013-02-14: 10 mg via INTRAVENOUS

## 2013-02-14 MED ORDER — HYDRALAZINE HCL 20 MG/ML IJ SOLN
1.0000 mg | Freq: Once | INTRAMUSCULAR | Status: AC
  Administered 2013-02-14: 1 mg via INTRAVENOUS

## 2013-02-14 MED ORDER — HYDROMORPHONE HCL PF 1 MG/ML IJ SOLN: 0.2500 mg | INTRAMUSCULAR | Status: DC | PRN

## 2013-02-14 MED ORDER — OXYCODONE HCL 5 MG PO TABS: 5.0000 mg | ORAL_TABLET | ORAL | Status: DC | PRN

## 2013-02-14 MED ORDER — EPHEDRINE SULFATE 50 MG/ML IJ SOLN
INTRAMUSCULAR | Status: DC | PRN
  Administered 2013-02-14: 10 mg via INTRAVENOUS

## 2013-02-14 MED ORDER — PROMETHAZINE HCL 25 MG/ML IJ SOLN
6.2500 mg | INTRAMUSCULAR | Status: DC | PRN
  Administered 2013-02-14: 6.25 mg via INTRAVENOUS

## 2013-02-14 MED ORDER — FENTANYL CITRATE 0.05 MG/ML IJ SOLN
25.0000 ug | INTRAMUSCULAR | Status: DC | PRN
  Administered 2013-02-14 (×3): 25 ug via INTRAVENOUS

## 2013-02-14 MED ORDER — PROPOFOL 10 MG/ML IV BOLUS
INTRAVENOUS | Status: DC | PRN
  Administered 2013-02-14: 110 mg via INTRAVENOUS

## 2013-02-14 MED ORDER — SODIUM CHLORIDE 0.9 % IJ SOLN
INTRAMUSCULAR | Status: AC
  Filled 2013-02-14: qty 20

## 2013-02-14 MED ORDER — ACETAMINOPHEN 325 MG PO TABS
650.0000 mg | ORAL_TABLET | Freq: Four times a day (QID) | ORAL | Status: DC
  Administered 2013-02-14 – 2013-02-16 (×5): 650 mg via ORAL
  Filled 2013-02-14 (×10): qty 2

## 2013-02-14 MED ORDER — LACTATED RINGERS IV SOLN
INTRAVENOUS | Status: DC
  Administered 2013-02-14: 1000 mL via INTRAVENOUS

## 2013-02-14 MED ORDER — SODIUM CHLORIDE 0.9 % IV SOLN
INTRAVENOUS | Status: DC | PRN
  Administered 2013-02-14: 20 mL via INTRAMUSCULAR

## 2013-02-14 MED ORDER — ENOXAPARIN SODIUM 40 MG/0.4ML ~~LOC~~ SOLN
40.0000 mg | SUBCUTANEOUS | Status: AC
  Administered 2013-02-14: 40 mg via SUBCUTANEOUS
  Filled 2013-02-14: qty 0.4

## 2013-02-14 MED ORDER — ZOLPIDEM TARTRATE 5 MG PO TABS: 5.0000 mg | ORAL_TABLET | Freq: Every evening | ORAL | Status: DC | PRN

## 2013-02-14 MED ORDER — BUPIVACAINE LIPOSOME 1.3 % IJ SUSP
INTRAMUSCULAR | Status: DC | PRN
  Administered 2013-02-14: 20 mL

## 2013-02-14 MED ORDER — PROMETHAZINE HCL 25 MG/ML IJ SOLN
INTRAMUSCULAR | Status: AC
  Filled 2013-02-14: qty 1

## 2013-02-14 MED ORDER — BUPIVACAINE LIPOSOME 1.3 % IJ SUSP
20.0000 mL | Freq: Once | INTRAMUSCULAR | Status: DC
  Filled 2013-02-14: qty 20

## 2013-02-14 MED ORDER — HYDROMORPHONE HCL PF 1 MG/ML IJ SOLN: 0.5000 mg | INTRAMUSCULAR | Status: DC | PRN

## 2013-02-14 MED ORDER — FENTANYL CITRATE 0.05 MG/ML IJ SOLN
INTRAMUSCULAR | Status: DC | PRN
  Administered 2013-02-14: 25 ug via INTRAVENOUS
  Administered 2013-02-14 (×2): 50 ug via INTRAVENOUS
  Administered 2013-02-14: 25 ug via INTRAVENOUS
  Administered 2013-02-14: 50 ug via INTRAVENOUS
  Administered 2013-02-14: 25 ug via INTRAVENOUS

## 2013-02-14 MED ORDER — FENTANYL CITRATE 0.05 MG/ML IJ SOLN
INTRAMUSCULAR | Status: AC
  Filled 2013-02-14: qty 2

## 2013-02-14 SURGICAL SUPPLY — 51 items
ATTRACTOMAT 16X20 MAGNETIC DRP (DRAPES) ×3 IMPLANT
BAG URINE DRAINAGE (UROLOGICAL SUPPLIES) ×3 IMPLANT
BENZOIN TINCTURE PRP APPL 2/3 (GAUZE/BANDAGES/DRESSINGS) ×3 IMPLANT
BLADE EXTENDED COATED 6.5IN (ELECTRODE) ×3 IMPLANT
CANISTER SUCTION 2500CC (MISCELLANEOUS) ×3 IMPLANT
CHLORAPREP W/TINT 26ML (MISCELLANEOUS) ×3 IMPLANT
CLIP TI MEDIUM LARGE 6 (CLIP) ×3 IMPLANT
CLOTH BEACON ORANGE TIMEOUT ST (SAFETY) ×3 IMPLANT
CONT SPEC 4OZ CLIKSEAL STRL BL (MISCELLANEOUS) ×3 IMPLANT
COVER SURGICAL LIGHT HANDLE (MISCELLANEOUS) ×3 IMPLANT
DRAPE UTILITY 15X26 (DRAPE) ×3 IMPLANT
DRAPE UTILITY XL STRL (DRAPES) ×3 IMPLANT
DRAPE WARM FLUID 44X44 (DRAPE) ×3 IMPLANT
DRESSING TELFA ISLAND 4X8 (GAUZE/BANDAGES/DRESSINGS) ×3 IMPLANT
ELECT BLADE 6.5 EXT (BLADE) ×3 IMPLANT
ELECT REM PT RETURN 9FT ADLT (ELECTROSURGICAL) ×3
ELECTRODE REM PT RTRN 9FT ADLT (ELECTROSURGICAL) ×2 IMPLANT
GAUZE SPONGE 4X4 16PLY XRAY LF (GAUZE/BANDAGES/DRESSINGS) ×3 IMPLANT
GLOVE BIO SURGEON STRL SZ 6.5 (GLOVE) ×3 IMPLANT
GLOVE BIOGEL M STRL SZ7.5 (GLOVE) ×15 IMPLANT
GOWN PREVENTION PLUS LG XLONG (DISPOSABLE) ×3 IMPLANT
GOWN STRL REIN XL XLG (GOWN DISPOSABLE) ×3 IMPLANT
KIT BASIN OR (CUSTOM PROCEDURE TRAY) ×3 IMPLANT
LIGASURE IMPACT 36 18CM CVD LR (INSTRUMENTS) IMPLANT
NS IRRIG 1000ML POUR BTL (IV SOLUTION) ×12 IMPLANT
PACK GENERAL/GYN (CUSTOM PROCEDURE TRAY) ×3 IMPLANT
SHEET LAVH (DRAPES) ×3 IMPLANT
SPONGE GAUZE 4X4 12PLY (GAUZE/BANDAGES/DRESSINGS) ×3 IMPLANT
SPONGE LAP 18X18 X RAY DECT (DISPOSABLE) ×6 IMPLANT
STAPLER SKIN PROX WIDE 3.9 (STAPLE) ×3 IMPLANT
STAPLER VISISTAT 35W (STAPLE) ×3 IMPLANT
STRIP CLOSURE SKIN 1/2X4 (GAUZE/BANDAGES/DRESSINGS) ×3 IMPLANT
SUT ETHILON 1 LR 30 (SUTURE) IMPLANT
SUT PDS AB 1 CTXB1 36 (SUTURE) ×6 IMPLANT
SUT SILK 2 0 (SUTURE) ×1
SUT SILK 2 0 30  PSL (SUTURE)
SUT SILK 2 0 30 PSL (SUTURE) IMPLANT
SUT SILK 2-0 18XBRD TIE 12 (SUTURE) ×2 IMPLANT
SUT VIC AB 0 CT1 36 (SUTURE) ×9 IMPLANT
SUT VIC AB 2-0 CT2 27 (SUTURE) ×21 IMPLANT
SUT VIC AB 2-0 SH 27 (SUTURE) ×6
SUT VIC AB 2-0 SH 27X BRD (SUTURE) ×12 IMPLANT
SUT VIC AB 3-0 CTX 36 (SUTURE) IMPLANT
SUT VIC AB 3-0 SH 27 (SUTURE) ×2
SUT VIC AB 3-0 SH 27X BRD (SUTURE) ×4 IMPLANT
SUT VICRYL 2 0 18  UND BR (SUTURE) ×1
SUT VICRYL 2 0 18 UND BR (SUTURE) ×2 IMPLANT
TOWEL OR 17X26 10 PK STRL BLUE (TOWEL DISPOSABLE) ×3 IMPLANT
TOWEL OR NON WOVEN STRL DISP B (DISPOSABLE) ×3 IMPLANT
TRAY FOLEY CATH 14FRSI W/METER (CATHETERS) ×3 IMPLANT
WATER STERILE IRR 1500ML POUR (IV SOLUTION) ×3 IMPLANT

## 2013-02-14 NOTE — Anesthesia Preprocedure Evaluation (Addendum)
Anesthesia Evaluation  Patient identified by MRN, date of birth, ID band Patient awake    Reviewed: Allergy & Precautions, H&P , NPO status , Patient's Chart, lab work & pertinent test results  Airway Mallampati: II TM Distance: >3 FB Neck ROM: Full    Dental  (+) Edentulous Upper and Edentulous Lower   Pulmonary neg pulmonary ROS,  breath sounds clear to auscultation  Pulmonary exam normal       Cardiovascular Exercise Tolerance: Good negative cardio ROS  Rhythm:Regular Rate:Normal     Neuro/Psych negative neurological ROS  negative psych ROS   GI/Hepatic negative GI ROS, Neg liver ROS,   Endo/Other  negative endocrine ROS  Renal/GU negative Renal ROS  negative genitourinary   Musculoskeletal negative musculoskeletal ROS (+)   Abdominal   Peds negative pediatric ROS (+)  Hematology negative hematology ROS (+)   Anesthesia Other Findings   Reproductive/Obstetrics negative OB ROS                          Anesthesia Physical Anesthesia Plan  ASA: III  Anesthesia Plan: General   Post-op Pain Management:    Induction: Intravenous  Airway Management Planned: Oral ETT  Additional Equipment:   Intra-op Plan:   Post-operative Plan: Extubation in OR  Informed Consent: I have reviewed the patients History and Physical, chart, labs and discussed the procedure including the risks, benefits and alternatives for the proposed anesthesia with the patient or authorized representative who has indicated his/her understanding and acceptance.   Dental advisory given  Plan Discussed with: CRNA  Anesthesia Plan Comments: (Sensitive to medication, hard to wake after anesthesia several years ago.)        Anesthesia Quick Evaluation

## 2013-02-14 NOTE — Anesthesia Postprocedure Evaluation (Signed)
  Anesthesia Post-op Note  Patient: Jasmine Cooper  Procedure(s) Performed: Procedure(s) (LRB): HYSTERECTOMY TOTAL ABDOMINAL (N/A) EXPLORATORY LAPAROTOMY (N/A) BILATERAL SALPINGO OOPHORECTOMY (Bilateral)  Patient Location: PACU  Anesthesia Type: General  Level of Consciousness: awake and alert   Airway and Oxygen Therapy: Patient Spontanous Breathing  Post-op Pain: mild  Post-op Assessment: Post-op Vital signs reviewed, Patient's Cardiovascular Status Stable, Respiratory Function Stable, Patent Airway and No signs of Nausea or vomiting  Last Vitals:  Filed Vitals:   02/14/13 1430  BP: 168/89  Pulse: 60  Temp: 36.6 C  Resp: 18    Post-op Vital Signs: stable   Complications: No apparent anesthesia complications

## 2013-02-14 NOTE — Preoperative (Signed)
Beta Blockers   Reason not to administer Beta Blockers:Not Applicable 

## 2013-02-14 NOTE — Op Note (Signed)
Jasmine Cooper  female MEDICAL RECORD NW:[id number] DATE OF BIRTH: Sep 05, 1929 PHYSICIAN: De Blanch, M.D  02/14/2013   OPERATIVE REPORT  PREOPERATIVE DIAGNOSIS: Squamous cell carcinoma of the endometrium  POSTOPERATIVE DIAGNOSIS: Same  PROCEDURE: Total abdominal hysterectomy, bilateral salpingo-oophorectomy  SURGEON: De Blanch, M.D ASSISTANT: Antionette Char M.D. ANESTHESIA: Gen. with oral tracheal tube ESTIMATED BLOOD LOSS: 100 mL  SURGICAL FINDINGS: At the time of exploratory laparotomy the upper abdomen was normal. There were no enlarged pelvic or periaortic lymph nodes. Uterus was upper limits of normal size for woman of her age. There was a 2 cm nodule on the posterior aspect of the uterine fundus which on frozen section was found to be invasive squamous cell carcinoma through the myometrium and uterine serosa. At this site and appendiceal epiploica was adherent from the sigmoid colon. The tubes and ovaries appeared normal the appendix was surgically absent.  PROCEDURE: The patient was brought to the operating room and after satisfactory attainment of general anesthesia was placed in a modified lithotomy position in [place] stirrups. The anterior abdominal wall, perineum and vagina were prepped, Foley catheter was inserted, the patient was draped. A time-out was taken, SCDs were in place, prophylactic antibiotics were administered.  The abdomen was entered through a Pfannenstiel incision. Peritoneal washings were taken and sent to cytopathology.   Bookwalter retractor was assembled and  the small bowel and sigmoid colon were elevated out of the pelvis thus exposing the uterus. The uterus was grasped with two long Kelly clamps.  The right round ligament was divided the retroperitoneal spaces opened. The iliac vessels and ureter were identified. The ovarian vessels were skeletonized clamped, cut, free tied and suture ligated. Similar procedures performed left  side the pelvis. The bladder flap was advanced with sharp and blunt dissection. Uterine vessels were skeletonized then clamped cut and suture ligated. In the paracervical and cardinal ligaments were clamped, cut and suture ligated. Vaginal angles were encountered, crossclamped and the vagina transected from its connection to the cervix. The uterus, cervix,tubes and ovaries were handed off the operative field. The vaginal angles were transfixed with 0 Vicryl the central portion of vagina closed with interrupted figure-of-eight sutures of 0 Vicryl. The pelvis was irrigated and hemostasis was ascertained.  The retractor was taken down   The abdomen and pelvis were irrigated.  The anterior abdominal wall was closed in layers the first being a running closure of the peritoneum using 0 Vicryl. The fascia was closed with a running #1 PDS. Subcutaneous tissue was irrigated and hemostasis achieved with cautery. Experal (266 mg diluted in 40 ml saline)was injected into the subcutaneous layer. Skin was closed with a running subcuticular closure of 3-0 Vicryl. Steri-Strips were applied. A dressing was applied.  Patient was awakened from anesthesia and taken to the recovery room in satisfactory condition. Sponge needle and instrument counts correct times two.   De Blanch, M.D

## 2013-02-14 NOTE — H&P (View-Only) (Signed)
**Note Jasmine-Identified via Obfuscation** Consult Note: Gyn-Onc  Consult was requested by Dr. Seymour Cooper for the evaluation of Jasmine Cooper 79 y.o. female  CC: Endometrial squamous cell cancer.  Assessment/Plan:  This is a lovely 79 year old who is much younger than her chronological age with an endometrial squamous cell cancer. On physical examination no lesions are identified within the cervix or vagina. On evaluation of the hysteroscopic operative report a lesion in the uterus was clearly identified. This patient is adamant that she would not accept chemotherapy but however would consider radiotherapy. The options presented for management of this presumed uterine squamous cell cancer were either open or minimally invasive hysterectomy with a minimal pelvic lymph node dissection.  There is minimal uterine descensus.  Patient opted for an opened staging procedure and as noted before by Dr. De Cooper on 02/14/2013 the risks of the procedure discussed with the patient were that of infection bleeding damage to surrounding structures prolonged hospitalization, reoperation, deep venous thrombosis and embolism.   History of present illness: Jasmine Cooper is a 79 y.o. year old G4P4 who presented to Dr. Seymour Cooper with c/o post menopausal bleeding since July 2014. She reports intentional weight loss over the last 2-3 months of approximately 3-4 pounds. Pelvic UTZ was c/w a thickened endometrial stripe measuring 24.4 mm. Uterine length is 6.5 cm the adnexa were without any identifiable abnormalities. An endometrial biopsy was attempted on 01/16/2013 returned evidence of atypical squamous metaplasia Hysteroscopy D&C performed 02/02/2013 by Dr. Seymour Cooper. Findings notable for"on the anterior lower aspect of the intrauterine cavity, towards the left side, a multi-polypoid vascular lesion is present". No mention was made of an abnormal appearing cervix. Endometrial biopsy c/w SCCA.     Current Meds:  Outpatient Encounter Prescriptions as of  02/07/2013  Medication Sig Dispense Refill  . hydrocodone-ibuprofen (VICOPROFEN) 5-200 MG per tablet Take 1 tablet by mouth every 8 (eight) hours as needed for pain.  30 tablet  0   No facility-administered encounter medications on file as of 02/07/2013.    Allergy:  Allergies  Allergen Reactions  . Codeine Nausea And Vomiting  . Morphine And Related Nausea And Vomiting    Social Hx:   History   Social History  . Marital Status: Widowed    Spouse Name: N/A    Number of Children: N/A  . Years of Education: N/A   Occupational History  . Not on file.   Social History Main Topics  . Smoking status: Former Games developer  . Smokeless tobacco: Not on file  . Alcohol Use: 0.6 oz/week    1 Glasses of wine per week     Comment: rarely  . Drug Use: No  . Sexual Activity: Not on file   Other Topics Concern  . Not on file   Social History Narrative  . No narrative on file    Past Surgical Hx:  Past Surgical History  Procedure Laterality Date  . Appendectomy    . Breast lumpectomy Left     benign cyst  . Dilitation & currettage/hystroscopy with versapoint resection N/A 02/02/2013    Procedure: DILATATION & CURETTAGE/HYSTEROSCOPY WITH VERSAPOINT RESECTION;  Surgeon: Jasmine Del, MD;  Location: [facility];  Service: Gynecology;  Laterality: N/A;  . Cataract extraction  2009   Jasmine Cooper  is widowed and has 2 sons who reside in the area. Her daughters reside in [place] and plan to travel back to the area for the operative procedure.  The patient performs all her of her activities of daily living and  uses a Cytogeneticist. She is desirous of a more up to date and current model.  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 Complication of anesthesia     very sensitive to medication. hard to wake after anes several  yrs ago.  . Medical history non-contributory     Past Gynecological History:  Gravida 4 para  4 for normal spontaneous vaginal deliveries menarche at age 53 with regular menses until her early 54s. Denies any history of abnormal Pap tests.  Family Hx:  Family History  Problem Relation Age of Onset  . Cancer Maternal Grandmother   . Breast cancer Daughter     Review of Systems:  Constitutional  Feels well,  Cardiovascular  No chest pain, shortness of breath, or edema  Pulmonary  No cough or wheeze.  Gastro Intestinal  No nausea, vomitting, or diarrhoea. No bright red blood per rectum, no abdominal pain, change in bowel movement, or constipation.  Genito Urinary  No frequency, urgency, dysuria, intermittent vaginal spotting no vaginal discharge no incontinence Musculo Skeletal  No myalgia, arthralgia, joint swelling or pain  Neurologic  No weakness, numbness, change in gait,  Psychology  No depression, anxiety, insomnia. Normal mood and affect appropriate judgment  Vitals:  Blood pressure 162/90, pulse 69, temperature 98 F (36.7 C), resp. rate 16, height 5' 2.4" (1.585 m), weight 145 lb 14.4 oz (66.18 kg).  Physical Exam: WD in NAD appears and acts much younger than her chronological age Neck  Supple NROM, without any enlargements.  Lymph Node Survey No cervical supraclavicular or inguinal adenopathy Cardiovascular  Pulse normal rate, regularity and rhythm. S1 and S2 normal.  Lungs  Clear to auscultation bilateraly, without wheezes/crackles/rhonchi. Good air movement.  Skin  No rash/lesions/breakdown  Psychiatry  Alert and oriented to person, place, and time  Abdomen  Normoactive bowel sounds, abdomen soft, non-tender and obese. Old appendectomy scar without evidence of hernia. No fluid wave no palpable omental cake Back No CVA tenderness Genito Urinary  Vulva/vagina: Normal external female genitalia.  No lesions. No discharge or bleeding.  Bladder/urethra:  No lesions or masses  Vagina: Atrophic without any lesions or masses  Cervix: Normal appearing, no  lesions. Good vaginal support  Uterus: Small, mobile, no parametrial involvement or nodularity.  Adnexa: No palpable masses. No cul Jasmine sac nodularity. Rectal  Good tone, no masses no cul Jasmine sac nodularity.  Extremities  No bilateral cyanosis, clubbing or edema.   Laurette Schimke, MD, PhD 02/07/2013, 12:35 PM

## 2013-02-14 NOTE — Interval H&P Note (Signed)
History and Physical Interval Note:  02/14/2013 8:52 AM  Jasmine Cooper  has presented today for surgery, with the diagnosis of endometrial cancer  The various methods of treatment have been discussed with the patient and family. After consideration of risks, benefits and other options for treatment, the patient has consented to  Procedure(s): HYSTERECTOMY TOTAL ABDOMINAL/POSSIBLE LYMPH NODE DISSECTION (N/A) EXPLORATORY LAPAROTOMY (N/A) BILATERAL SALPINGO OOPHORECTOMY (Bilateral) as a surgical intervention .  The patient's history has been reviewed, patient examined, no change in status, stable for surgery.  I have reviewed the patient's chart and labs.  Questions were answered to the patient's satisfaction.     CLARKE-PEARSON,Hatcher Froning L

## 2013-02-14 NOTE — Transfer of Care (Signed)
Immediate Anesthesia Transfer of Care Note  Patient: Lenaya Pietsch Blodgett  Procedure(s) Performed: Procedure(s): HYSTERECTOMY TOTAL ABDOMINAL (N/A) EXPLORATORY LAPAROTOMY (N/A) BILATERAL SALPINGO OOPHORECTOMY (Bilateral)  Patient Location: PACU  Anesthesia Type:General  Level of Consciousness: awake, oriented and patient cooperative  Airway & Oxygen Therapy: Patient Spontanous Breathing and Patient connected to face mask oxygen  Post-op Assessment: Report given to PACU RN and Post -op Vital signs reviewed and stable  Post vital signs: Reviewed and stable  Complications: No apparent anesthesia complications

## 2013-02-15 ENCOUNTER — Encounter (HOSPITAL_COMMUNITY): Payer: Self-pay | Admitting: Gynecology

## 2013-02-15 LAB — CBC
HCT: 38.7 % (ref 36.0–46.0)
Hemoglobin: 13 g/dL (ref 12.0–15.0)
MCH: 29.5 pg (ref 26.0–34.0)
MCV: 87.8 fL (ref 78.0–100.0)
Platelets: 255 10*3/uL (ref 150–400)
RBC: 4.41 MIL/uL (ref 3.87–5.11)

## 2013-02-15 LAB — BASIC METABOLIC PANEL
CO2: 24 mEq/L (ref 19–32)
Calcium: 8.8 mg/dL (ref 8.4–10.5)
Chloride: 101 mEq/L (ref 96–112)
GFR calc Af Amer: 43 mL/min — ABNORMAL LOW (ref >90)
Glucose, Bld: 135 mg/dL — ABNORMAL HIGH (ref 70–99)
Potassium: 4.4 mEq/L (ref 3.5–5.1)
Sodium: 135 mEq/L (ref 135–145)

## 2013-02-15 MED ORDER — IBUPROFEN 600 MG PO TABS
600.0000 mg | ORAL_TABLET | Freq: Three times a day (TID) | ORAL | Status: DC
  Administered 2013-02-15 – 2013-02-16 (×3): 600 mg via ORAL
  Filled 2013-02-15 (×6): qty 1

## 2013-02-15 NOTE — Progress Notes (Signed)
Low urine output -Answering Service called to notify MD x 2 -  Calls were unanswered. Following voice prompts.

## 2013-02-15 NOTE — Progress Notes (Signed)
1 Day Post-Op Procedure(s) (LRB): HYSTERECTOMY TOTAL ABDOMINAL (N/A) EXPLORATORY LAPAROTOMY (N/A) BILATERAL SALPINGO OOPHORECTOMY (Bilateral)  Subjective: Patient reports doing well this am.  Tolerating solid food but complaining of a moderate dry mouth.  Ambulating with assist without difficulty.  Minimal pain reported.  Denies chest pain, dyspnea, nausea, vomiting, passing flatus, or having a bowel movement.  No concerns voiced.  Objective: Vital signs in last 24 hours: Temp:  [97.4 F (36.3 C)-98.2 F (36.8 C)] 97.4 F (36.3 C) (10/08 0500) Pulse Rate:  [18-64] 64 (10/08 0500) Resp:  [12-20] 16 (10/08 0500) BP: (122-191)/(61-89) 122/65 mmHg (10/08 0500) SpO2:  [97 %-100 %] 99 % (10/08 0500)    Intake/Output from previous day: 10/07 0701 - 10/08 0700 In: 3945 [P.O.:720; I.V.:3225] Out: 1575 [Urine:1500; Blood:75]  Physical Examination: General: alert, cooperative and no distress Resp: clear to auscultation bilaterally Cardio: regular rate and rhythm, S1, S2 normal, no murmur, click, rub or gallop GI: soft, non-tender; bowel sounds normal; no masses,  no organomegaly and incision: low transverse incision with steri strips, steri strips lightly stained with dried blood, no drainage, bleeding, or erythema noted Extremities: extremities normal, atraumatic, no cyanosis or edema  Labs: WBC/Hgb/Hct/Plts:  15.0/13.0/38.7/255 (10/08 0455) BUN/Cr/glu/ALT/AST/amyl/lip:  21/1.30/--/--/--/--/-- (10/08 0455)  Assessment: 79 y.o. s/p Procedure(s): HYSTERECTOMY TOTAL ABDOMINAL EXPLORATORY LAPAROTOMY BILATERAL SALPINGO OOPHORECTOMY: stable Pain:  Pain is well-controlled on PRN medications.  Heme:  Hgb 13.0 and Hct 38.7 this am.  Stable post-operatively.  CV:  BP and HR stable post-operatively.  GI:  Tolerating po: Yes     FEN:  Stable post-operatively.  Prophylaxis: pharmacologic prophylaxis (with any of the following: enoxaparin (Lovenox) 40mg  SQ 2 hours prior to surgery then  every day) and intermittent pneumatic compression boots.  Plan: Saline lock IV Diet as tolerated Encourage ambulation, IS use, deep breathing, and coughing Continue post-operative plan of care per Dr. Tamela Oddi   LOS: 1 day    CROSS, MELISSA DEAL 02/15/2013, 8:30 AM

## 2013-02-15 NOTE — Care Management Note (Signed)
    Page 1 of 1   02/15/2013     10:23:18 AM   CARE MANAGEMENT NOTE 02/15/2013  Patient:  JOSEPH, JOHNS   Account Number:  [id number]  Date Initiated:  02/15/2013  Documentation initiated by:  Lorenda Ishihara  Subjective/Objective Assessment:   79 yo female admitted s/p TAH, BSO. PTA lived at home alone.     Action/Plan:   Home when stable   Anticipated DC Date:  02/18/2013   Anticipated DC Plan:  HOME/SELF CARE      DC Planning Services  CM consult      Choice offered to / List presented to:             Status of service:  Completed, signed off Medicare Important Message given?   (If response is "NO", the following Medicare IM given date fields will be blank) Date Medicare IM given:   Date Additional Medicare IM given:    Discharge Disposition:  HOME/SELF CARE  Per UR Regulation:  Reviewed for med. necessity/level of care/duration of stay  If discussed at Long Length of Stay Meetings, dates discussed:    Comments:

## 2013-02-16 MED ORDER — ENOXAPARIN SODIUM 40 MG/0.4ML ~~LOC~~ SOLN: 40.0000 mg | Freq: Every day | SUBCUTANEOUS | Status: DC

## 2013-02-16 MED ORDER — TRAMADOL HCL 50 MG PO TABS: 50.0000 mg | ORAL_TABLET | Freq: Four times a day (QID) | ORAL | Status: DC | PRN

## 2013-02-16 NOTE — Discharge Summary (Signed)
Physician Discharge Summary  Patient ID: Jasmine Cooper MRN: [id number] DOB/AGE: Mar 17, 1930 79 y.o.  Admit date: 02/14/2013 Discharge date: 02/16/2013  Admission Diagnoses: Endometrial cancer  Discharge Diagnoses:  Principal Problem:   Endometrial cancer  Discharged Condition:  The patient is in good condition and stable for discharge.    Hospital Course: On 02/14/2013, the patient underwent the following: Procedure(s): HYSTERECTOMY TOTAL ABDOMINAL EXPLORATORY LAPAROTOMY BILATERAL SALPINGO OOPHORECTOMY.  The postoperative course was uneventful.  She was discharged to home on postoperative day 2 tolerating a regular diet and having bowel movements.  She was discharged with 26 days of lovenox for DVT prophylaxis.  Her daughter has administered the medication before and they both feel that they do not need to speak with the pharmacist prior to discharge about the lovenox injections.  Her final pathology was discussed prior to discharge.  Consults: None  Significant Diagnostic Studies: See above  Treatments: surgery: see above  Discharge Exam: Blood pressure 118/82, pulse 61, temperature 97.5 F (36.4 C), temperature source Oral, resp. rate 16, height 5' 2.25" (1.581 m), weight 143 lb (64.864 kg), SpO2 96.00%. General appearance: alert, cooperative and no distress Resp: clear to auscultation bilaterally Cardio: regular rate and rhythm, S1, S2 normal, no murmur, click, rub or gallop GI: soft, non-tender; bowel sounds normal; no masses,  no organomegaly Extremities: extremities normal, atraumatic, no cyanosis or edema Incision/Wound: low transverse incision with steri strips lightly bruised, no drainage or erythema noted  Disposition: 01-Home or Self Care      Discharge Orders   Future Appointments Provider Department Dept Phone   03/10/2013 1:30 PM Jeannette Corpus, MD [facility] [phone]   Future Orders Complete By Expires   Call MD for:  difficulty breathing, headache or visual disturbances  As directed    Call MD for:  extreme fatigue  As directed    Call MD for:  hives  As directed    Call MD for:  persistant dizziness or light-headedness  As directed    Call MD for:  persistant nausea and vomiting  As directed    Call MD for:  redness, tenderness, or signs of infection (pain, swelling, redness, odor or green/yellow discharge around incision site)  As directed    Call MD for:  severe uncontrolled pain  As directed    Call MD for:  temperature >100.4  As directed    Diet - low sodium heart healthy  As directed    Driving Restrictions  As directed    Comments:     No driving for 2 weeks.  Do not take narcotics and drive.   Increase activity slowly  As directed    Lifting restrictions  As directed    Comments:     No lifting greater than 10 lbs.   Sexual Activity Restrictions  As directed    Comments:     No sexual activity, nothing in the vagina, for 6 weeks.       Medication List    STOP taking these medications       sulfamethoxazole-trimethoprim 800-160 MG per tablet  Commonly known as:  BACTRIM DS      TAKE these medications       enoxaparin 40 MG/0.4ML injection  Commonly known as:  LOVENOX  Inject 0.4 mLs (40 mg total) into the skin daily.     ibuprofen 200 MG tablet  Commonly known as:  ADVIL,MOTRIN  Take 400 mg by mouth every 6 (six) hours as  needed for pain.     multivitamin with minerals Tabs tablet  Take 1 tablet by mouth daily.     traMADol 50 MG tablet  Commonly known as:  ULTRAM  Take 1 tablet (50 mg total) by mouth every 6 (six) hours as needed for pain.       Follow-up Information   Follow up with Jeannette Corpus, MD On 03/10/2013. (at 1:30pm)    Specialty:  Gynecology   Contact information:   [address] [place] [address] [phone]       Greater than thirty minutes were spend for face to face discharge instructions and discharge  orders/summary in EPIC.   Signed: Naydene Kamrowski DEAL 02/16/2013, 11:05 AM

## 2013-03-10 ENCOUNTER — Ambulatory Visit: Payer: Medicare Other | Attending: Gynecology | Admitting: Gynecology

## 2013-03-10 ENCOUNTER — Encounter: Payer: Self-pay | Admitting: Gynecology

## 2013-03-10 VITALS — BP 137/73 | HR 72 | Temp 97.8°F | Resp 18 | Ht 62.4 in | Wt 144.7 lb

## 2013-03-10 DIAGNOSIS — C541 Malignant neoplasm of endometrium: Secondary | ICD-10-CM

## 2013-03-10 DIAGNOSIS — Z90710 Acquired absence of both cervix and uterus: Secondary | ICD-10-CM | POA: Insufficient documentation

## 2013-03-10 DIAGNOSIS — Z9079 Acquired absence of other genital organ(s): Secondary | ICD-10-CM | POA: Insufficient documentation

## 2013-03-10 DIAGNOSIS — C549 Malignant neoplasm of corpus uteri, unspecified: Secondary | ICD-10-CM | POA: Insufficient documentation

## 2013-03-10 NOTE — Progress Notes (Signed)
Consult Note: Gyn-Onc   Jasmine Cooper 79 y.o. female  Chief Complaint  Patient presents with  . Endometrial cancer    Follow up    Assessment : Stage II squamous cell carcinoma of the endometrium.  Plan: To complete staging we will obtain a CT scan of the abdomen and pelvis. The patient will be referred to radiation therapy for whole pelvis radiation therapy. She returned to see me in 3 weeks for postoperative checkup. The patient and her daughter are in agreement with this plan all their questions been answered.  Interval History: Since hospital to discharge the patient has done well. She's walking at least 20 minutes a day. She has minimal discomfort in her incision on the right. Appetite is good. She has no GI or GU symptoms.  HPI: Patient presented with postmenopausal bleeding in July 2014. A pelvic ultrasound showed a 24 mm endometrial stripe. D&C on 02/02/2013 revealing squamous cell carcinoma of the endometrium. The patient underwent a total abdominal hysterectomy bilateral salpingo-oophorectomy on October 7. Final pathology showed invasive moderately differentiated squamous cell carcinoma which invaded the entire myometrium and involving the uterine serosa. Focally involved the cervical stroma. Careful study of the entire specimen indicates that this indeed is an endometrial squamous cell carcinoma tubes ovaries and peritoneal washings were negative.  Because of high risk features I recommend the patient received whole pelvis radiation therapy.  Review of Systems:10 point review of systems is negative except as noted in interval history.   Vitals: Blood pressure 137/33, pulse 72, temperature 97.8 F (36.6 C), temperature source Oral, resp. rate 18, height 5' 2.4" (1.585 m), weight 144 lb 11.2 oz (65.635 kg).  Physical Exam: General : The patient is a healthy woman in no acute distress.  HEENT: normocephalic, extraoccular movements normal; neck is supple without  thyromegally  Lynphnodes: Supraclavicular and inguinal nodes not enlarged  Abdomen: Soft, non-tender, no ascites, no organomegally, no masses, no hernias, a Pfannenstiel incision is healing well.  Pelvic: Deferred  Lower extremities: No edema or varicosities. Normal range of motion      Allergies  Allergen Reactions  . Codeine Nausea And Vomiting  . Morphine And Related Nausea And Vomiting    Past Medical History  Diagnosis Date  . Complication of anesthesia     very sensitive to medication. hard to wake after anes several  yrs ago.  . Anemia 1960's  . Cancer     endometerial squamous cell    Past Surgical History  Procedure Laterality Date  . Dilitation & currettage/hystroscopy with versapoint resection N/A 02/02/2013    Procedure: DILATATION & CURETTAGE/HYSTEROSCOPY WITH VERSAPOINT RESECTION;  Surgeon: Genia Del, MD;  Location: [facility];  Service: Gynecology;  Laterality: N/A;  . Cataract extraction  2009  . Breast lumpectomy Left     benign cyst  . Appendectomy    . Abdominal hysterectomy N/A 02/14/2013    Procedure: HYSTERECTOMY TOTAL ABDOMINAL;  Surgeon: Jeannette Corpus, MD;  Location: [facility];  Service: Gynecology;  Laterality: N/A;  . Laparotomy N/A 02/14/2013    Procedure: EXPLORATORY LAPAROTOMY;  Surgeon: Jeannette Corpus, MD;  Location: [facility];  Service: Gynecology;  Laterality: N/A;  . Salpingoophorectomy Bilateral 02/14/2013    Procedure: BILATERAL SALPINGO OOPHORECTOMY;  Surgeon: Jeannette Corpus, MD;  Location: [facility];  Service: Gynecology;  Laterality: Bilateral;    Current Outpatient Prescriptions  Medication Sig Dispense Refill  . enoxaparin (LOVENOX) 40 MG/0.4ML injection Inject 0.4 mLs (40 mg total) into the skin daily.  26 Syringe  0  . ibuprofen (ADVIL,MOTRIN) 200 MG tablet Take 400 mg by mouth every 6 (six) hours as needed for pain.      . Multiple Vitamin (MULTIVITAMIN WITH MINERALS) TABS tablet Take 1 tablet by mouth daily.       . traMADol (ULTRAM) 50 MG tablet Take 1 tablet (50 mg total) by mouth every 6 (six) hours as needed for pain.  15 tablet  0   No current facility-administered medications for this visit.    History   Social History  . Marital Status: Widowed    Spouse Name: N/A    Number of Children: N/A  . Years of Education: N/A   Occupational History  . Not on file.   Social History Main Topics  . Smoking status: Former Smoker -- 1.00 packs/day for 30 years    Types: Cigarettes    Quit date: 05/11/1980  . Smokeless tobacco: Never Used  . Alcohol Use: 0.6 oz/week    1 Glasses of wine per week     Comment: rarely  . Drug Use: No  . Sexual Activity: Not on file   Other Topics Concern  . Not on file   Social History Narrative  . No narrative on file    Family History  Problem Relation Age of Onset  . Cancer Maternal Grandmother   . Breast cancer Daughter       Jeannette Corpus, MD 03/10/2013, 1:58 PM

## 2013-03-10 NOTE — Patient Instructions (Signed)
We will call you with your appointment date and time for Dr. Antony Blackbird

## 2013-03-16 ENCOUNTER — Ambulatory Visit (HOSPITAL_COMMUNITY)
Admission: RE | Admit: 2013-03-16 | Discharge: 2013-03-16 | Disposition: A | Discharge status: Home / Self Care | Payer: Medicare Other | Source: Ambulatory Visit | Attending: Gynecologic Oncology | Admitting: Gynecologic Oncology

## 2013-03-16 ENCOUNTER — Encounter (HOSPITAL_COMMUNITY): Payer: Self-pay

## 2013-03-16 DIAGNOSIS — N281 Cyst of kidney, acquired: Secondary | ICD-10-CM | POA: Insufficient documentation

## 2013-03-16 DIAGNOSIS — C549 Malignant neoplasm of corpus uteri, unspecified: Secondary | ICD-10-CM | POA: Insufficient documentation

## 2013-03-16 DIAGNOSIS — I700 Atherosclerosis of aorta: Secondary | ICD-10-CM | POA: Insufficient documentation

## 2013-03-16 DIAGNOSIS — C541 Malignant neoplasm of endometrium: Secondary | ICD-10-CM

## 2013-03-16 DIAGNOSIS — Z90710 Acquired absence of both cervix and uterus: Secondary | ICD-10-CM | POA: Insufficient documentation

## 2013-03-16 DIAGNOSIS — K7689 Other specified diseases of liver: Secondary | ICD-10-CM | POA: Insufficient documentation

## 2013-03-16 MED ORDER — IOHEXOL 300 MG/ML  SOLN
100.0000 mL | Freq: Once | INTRAMUSCULAR | Status: AC | PRN
  Administered 2013-03-16: 80 mL via INTRAVENOUS

## 2013-03-17 NOTE — Progress Notes (Signed)
GYN Location of Tumor / Histology: Squamous Cell Carcinoma of the Endometrium  Patient presented : Patient presented with postmenopausal bleeding in July 2014. A pelvic ultrasound showed a 24 mm endometrial stripe. D&C on 02/02/2013 revealing squamous cell carcinoma of the endometrium.  negative.   Biopsies of Endometrium (if applicable) revealed: Invasive Moderately Differentiated Squamous Cell Carcinoma   02/14/13  Diagnosis Uterus +/- tubes/ovaries, neoplastic, with cervix - INVASIVE MODERATELY DIFFERENTIATED SQUAMOUS CELL CARCINOMA, INVADING THROUGH THE MYOMETRIUM, INVOLVING THE UTERINE SEROSA, AND FOCALLY INVOLVING THE CERVICAL STROMA. - BILATERAL OVARIES AND FALLOPIAN TUBES: NO PATHOLOGIC ABNORMALITIES Careful study of the entire specimen indicates that this indeed is an endometrial squamous cell carcinoma tubes ovaries and peritoneal washings were negative.    02/02/13 Endometrium, curettage - INVASIVE SQUAMOUS CELL CARCINOMA  Past/Anticipated interventions by Gyn/Onc surgery, if any: Hysterectomy 02/14/13  Past/Anticipated interventions by medical oncology, if any: Chemotherapy not recommended and patient states she would not do if it were an option.  Weight changes, if any:No  Bowel/Bladder complaints, if any:Has frequency of urination.  Nausea/Vomiting, if any:No  Pain issues, if any:No  SAFETY ISSUES:  Prior radiation? No  Pacemaker/ICD? No  Possible current pregnancy? No  Is the patient on methotrexate? No  Current Complaints / other details:Patient has been relatively healthy.Alert, energetic and independent.Ready to get going with treatment.

## 2013-03-20 ENCOUNTER — Ambulatory Visit
Admission: RE | Admit: 2013-03-20 | Discharge: 2013-03-20 | Disposition: A | Discharge status: Home / Self Care | Payer: Medicare Other | Source: Ambulatory Visit | Attending: Radiation Oncology | Admitting: Radiation Oncology

## 2013-03-20 ENCOUNTER — Ambulatory Visit: Admission: RE | Admit: 2013-03-20 | Payer: Medicare Other | Source: Ambulatory Visit

## 2013-03-20 ENCOUNTER — Encounter: Payer: Self-pay | Admitting: Radiation Oncology

## 2013-03-20 VITALS — BP 180/94 | HR 70 | Temp 97.8°F | Resp 20 | Wt 145.8 lb

## 2013-03-20 DIAGNOSIS — C549 Malignant neoplasm of corpus uteri, unspecified: Secondary | ICD-10-CM | POA: Insufficient documentation

## 2013-03-20 DIAGNOSIS — Z9079 Acquired absence of other genital organ(s): Secondary | ICD-10-CM | POA: Insufficient documentation

## 2013-03-20 DIAGNOSIS — C541 Malignant neoplasm of endometrium: Secondary | ICD-10-CM

## 2013-03-20 DIAGNOSIS — Z87891 Personal history of nicotine dependence: Secondary | ICD-10-CM | POA: Insufficient documentation

## 2013-03-20 DIAGNOSIS — Z90710 Acquired absence of both cervix and uterus: Secondary | ICD-10-CM | POA: Insufficient documentation

## 2013-03-20 NOTE — Progress Notes (Signed)
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 female who is seen out of the courtesy of Dr. Serita Kyle for an opinion concerning radiation therapy as part of management of patient's recently diagnosed squamous cell carcinoma of the endometrium. Patient presented on August 31 with new onset postmenopausal vaginal bleeding. She promptly sought evaluation. Endometrial curettage revealed invasive squamous cell carcinoma.. The patient was taken to the operating room October 7 at which time she underwent total, hysterectomy, bilateral salpingo-oophorectomy. Pathology revealed invasive moderately differentiated squamous cell carcinoma invading through the myometrium, involving the uterine serosa and focally involving the cervical stroma. Pelvic nodal dissection was not performed. Peritoneal washings were negative. There was no spread to the ovaries or fallopian tubes. Patient has done well since her surgery. Given the high-risk features based on the path report, the patient is now seen in radiation oncology to consider postoperative treatments.  PREVIOUS RADIATION THERAPY: No  PAST MEDICAL HISTORY:  has a past medical history of Complication of anesthesia; Anemia (1960's); and Cancer.    PAST SURGICAL HISTORY: Past Surgical History  Procedure Laterality Date  . Dilitation & currettage/hystroscopy with versapoint resection N/A 02/02/2013    Procedure: DILATATION & CURETTAGE/HYSTEROSCOPY WITH VERSAPOINT RESECTION;  Surgeon: Genia Del, MD;  Location: [facility];  Service: Gynecology;  Laterality: N/A;  .  Cataract extraction  2009  . Breast lumpectomy Left     benign cyst  . Appendectomy  1960's  . Abdominal hysterectomy N/A 02/14/2013    Procedure: HYSTERECTOMY TOTAL ABDOMINAL;  Surgeon: Jeannette Corpus, MD;  Location: [facility];  Service: Gynecology;  Laterality: N/A;  . Laparotomy N/A 02/14/2013    Procedure: EXPLORATORY LAPAROTOMY;  Surgeon: Jeannette Corpus, MD;  Location: [facility];  Service: Gynecology;  Laterality: N/A;  . Salpingoophorectomy Bilateral 02/14/2013    Procedure: BILATERAL SALPINGO OOPHORECTOMY;  Surgeon: Jeannette Corpus, MD;  Location: [facility];  Service: Gynecology;  Laterality: Bilateral;  . Tonsillectomy      childhood    FAMILY HISTORY: family history includes Breast cancer in her daughter; Cancer in her maternal grandmother.  SOCIAL HISTORY:  reports that she quit smoking about 32 years ago. Her smoking use included Cigarettes. She has a 30 pack-year smoking history. She has never used smokeless tobacco. She reports that she drinks about 0.6 ounces of alcohol per week. She reports that she does not use illicit drugs. she has been a widow for approximately 25 years. Patient's husband died of leukemia.  ALLERGIES: Codeine and Morphine and related  MEDICATIONS:  Current Outpatient Prescriptions  Medication Sig Dispense Refill  . ibuprofen (ADVIL,MOTRIN) 200 MG tablet Take 400 mg by mouth every 6 (six) hours as needed for pain.      . Multiple Vitamin (MULTIVITAMIN WITH MINERALS) TABS tablet Take 1 tablet by mouth daily.       No current facility-administered medications for this encounter.    REVIEW OF SYSTEMS:  A 15 point review of systems is documented in the electronic medical record. This was obtained by the nursing staff. However, I reviewed this with the patient to discuss relevant findings and make appropriate changes.  She denies any further vaginal  bleeding. She denies any urination difficulties or bowel complaints since her surgery. The  patient denies any low back or flank pain. She does complain of some allergies at this time.  No cough or breathing problems.   PHYSICAL EXAM:  weight is 145 lb 12.8 oz (66.134 kg). Her temperature is 97.8 F (36.6 C). Her blood pressure is 180/94 and her pulse is 70. Her respiration is 20.   BP 180/94  Pulse 70  Temp(Src) 97.8 F (36.6 C)  Resp 20  Wt 145 lb 12.8 oz (66.134 kg)  General Appearance:    Alert, cooperative, no distress, appears stated age  Head:    Normocephalic, without obvious abnormality, atraumatic  Eyes:    PERRL, conjunctiva/corneas clear, EOM's intact    Ears:    Normal TM's and external ear canals, both ears  Nose:   Nares normal, septum midline, mucosa normal, no drainage    or sinus tenderness  Throat:   Lips, mucosa, and tongue normal; dentures in place, gums normal  Neck:   Supple, symmetrical, trachea midline, no adenopathy;    thyroid:  no enlargement/tenderness/nodules; no carotid   bruit or JVD  Back:     Symmetric, no curvature, ROM normal, no CVA tenderness  Lungs:     Clear to auscultation bilaterally, respirations unlabored  Chest Wall:    No tenderness or deformity   Heart:    Regular rate and rhythm, S1 and S2 normal, no murmur, rub   or gallop     Abdomen:     Soft, non-tender, bowel sounds active all four quadrants,    no masses, no organomegaly, horizontal scar in the lower abdomen healing well without signs of drainage or infection   Genitalia:    Normal female without lesion, discharge or tenderness, speculum exam reveals the vaginal cuff to be intact and healing well.      Extremities:   Extremities normal, atraumatic, no cyanosis or edema  Pulses:   2+ and symmetric all extremities  Skin:   Skin color, texture, turgor normal, no rashes or lesions  Lymph nodes:   Cervical, supraclavicular, and axillary nodes normal  Neurologic:    normal strength, sensation and reflexes    Throughout   ECOG = 1     LABORATORY DATA:  Lab Results    Component Value Date   WBC 15.0* 02/15/2013   HGB 13.0 02/15/2013   HCT 38.7 02/15/2013   MCV 87.8 02/15/2013   PLT 255 02/15/2013   Lab Results  Component Value Date   NA 135 02/15/2013   K 4.4 02/15/2013   CL 101 02/15/2013   CO2 24 02/15/2013   Lab Results  Component Value Date   ALT 16 02/09/2013   AST 24 02/09/2013   ALKPHOS 80 02/09/2013   BILITOT 0.3 02/09/2013     RADIOGRAPHY: Ct Abdomen Pelvis W Contrast  03/16/2013   CLINICAL DATA:  Endometrial cancer diagnosed 01/2013, status post total abdominal hysterectomy, XRT to start. Prior appendectomy.  EXAM: CT ABDOMEN AND PELVIS WITH CONTRAST  TECHNIQUE: Multidetector CT imaging of the abdomen and pelvis was performed using the standard protocol following bolus administration of intravenous contrast.  CONTRAST:  80mL OMNIPAQUE IOHEXOL 300 MG/ML  SOLN  COMPARISON:  None.  FINDINGS: Mild patchy nodularity in the posterior right middle lobe along the major fissure (Series 6/images 1 and 3), incompletely visualized, although favored to be infectious/inflammatory.  Liver is notable for focal fat/ altered perfusion along the falciform ligament (series 2/ image  23). Two additional tiny hepatic lesions are too small to characterize but likely reflect benign cysts.  Spleen, pancreas, and adrenal glands are within normal limits.  Gallbladder is unremarkable. No intrahepatic ductal dilatation. Common duct measures 10 mm, at the upper limits of normal for age, but smoothly tapers distally.  Two right lower pole renal cysts measuring up to 3.0 cm (series 2/ image 44). Tiny left renal cysts. No hydronephrosis.  No evidence of bowel obstruction. Prior appendectomy.  Atherosclerotic calcifications of the abdominal aorta and branch vessels.  No abdominopelvic ascites.  No suspicious abdominopelvic lymphadenopathy.  Status post hysterectomy.  No adnexal masses.  Bladder is notable for a minimal hyperdensity/enhancement along the left posterior bladder wall (series 2/  image 71), adjacent to the left UVJ. However, on delayed imaging (series 5), there is no associated abnormality.  Degenerative changes of the visualized thoracolumbar spine. Mild lumbar levoscoliosis.  IMPRESSION: Status post hysterectomy.  No findings suspicious for metastatic disease in the abdomen/pelvis.  Mild patchy nodularity in the posterior right middle lobe, incompletely visualized, although favored to be infectious/inflammatory. Consider CT chest for further evaluation as clinically warranted.   Electronically Signed   By: Charline Bills M.D.   On: 03/16/2013 11:54      IMPRESSION: Stage II squamous cell carcinoma of the endometrium  (pT3a, pNX, M0).  The patient would be a good candidate for postoperative radiation therapy directed at the pelvis area given her high-risk features as above. I would recommend intensity modulated radiation therapy in this situation to reduce dose to the small bowel, yet provide accurate coverage of the lymph node bearing areas. I will also discuss with gynecologic oncology whether the patient may benefit from vaginal brachytherapy at the completion of her external beam treatments.   PLAN: Simulation and planning tomorrow. I anticipate 5 weeks of postoperative treatments with consideration for brachytherapy boost.  I spent 60 minutes minutes face to face with the patient and more than 50% of that time was spent in counseling and/or coordination of care.   ------------------------------------------------  -----------------------------------  Billie Lade, PhD, MD

## 2013-03-20 NOTE — Progress Notes (Signed)
Please see the Nurse Progress Note in the MD Initial Consult Encounter for this patient. 

## 2013-03-21 ENCOUNTER — Ambulatory Visit
Admission: RE | Admit: 2013-03-21 | Discharge: 2013-03-21 | Disposition: A | Discharge status: Home / Self Care | Payer: Medicare Other | Source: Ambulatory Visit | Attending: Radiation Oncology | Admitting: Radiation Oncology

## 2013-03-21 VITALS — BP 181/87 | HR 88 | Temp 98.8°F | Resp 20 | Wt 145.0 lb

## 2013-03-21 DIAGNOSIS — C541 Malignant neoplasm of endometrium: Secondary | ICD-10-CM

## 2013-03-21 DIAGNOSIS — R5381 Other malaise: Secondary | ICD-10-CM | POA: Insufficient documentation

## 2013-03-21 DIAGNOSIS — C549 Malignant neoplasm of corpus uteri, unspecified: Secondary | ICD-10-CM | POA: Insufficient documentation

## 2013-03-21 DIAGNOSIS — Z510 Encounter for antineoplastic radiation therapy: Secondary | ICD-10-CM | POA: Insufficient documentation

## 2013-03-21 LAB — CBC WITH DIFFERENTIAL/PLATELET
BASO%: 0.6 % (ref 0.0–2.0)
Basophils Absolute: 0.1 10*3/uL (ref 0.0–0.1)
EOS%: 6.7 % (ref 0.0–7.0)
HCT: 44.1 % (ref 34.8–46.6)
HGB: 14.7 g/dL (ref 11.6–15.9)
LYMPH%: 21.2 % (ref 14.0–49.7)
MCH: 29.9 pg (ref 25.1–34.0)
MCHC: 33.4 g/dL (ref 31.5–36.0)
MCV: 89.5 fL (ref 79.5–101.0)
MONO#: 0.6 10*3/uL (ref 0.1–0.9)
NEUT#: 6.1 10*3/uL (ref 1.5–6.5)
NEUT%: 64.9 % (ref 38.4–76.8)
Platelets: 256 10*3/uL (ref 145–400)
WBC: 9.4 10*3/uL (ref 3.9–10.3)

## 2013-03-21 LAB — BASIC METABOLIC PANEL (CC13)
Anion Gap: 11 mEq/L (ref 3–11)
CO2: 26 mEq/L (ref 22–29)
Calcium: 10.1 mg/dL (ref 8.4–10.4)
Creatinine: 0.9 mg/dL (ref 0.6–1.1)
Potassium: 4 mEq/L (ref 3.5–5.1)

## 2013-03-21 MED ORDER — SODIUM CHLORIDE 0.9 % IJ SOLN
10.0000 mL | Freq: Once | INTRAMUSCULAR | Status: AC
  Administered 2013-03-21: 10 mL via INTRAVENOUS

## 2013-03-21 NOTE — Progress Notes (Signed)
Patient gave name,dob as identification, not allergic to IV dye,not a diabetic, labs done this am  b-met, BUN=17.2,CR=0.9 will start IV for Ct-Sim, IV started #22g, 1 inch catheter needle x 1 attempt, right hand, excellent blood return, flushed with 10 ml NS, patient tolerated well,, no c/o pain, informed Danford Bad and Lenore,RT therapist patient is ready in room 4,

## 2013-03-22 NOTE — Progress Notes (Signed)
  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  Identity was confirmed.  All relevant records and images related to the planned course of therapy were reviewed.  The patient freely provided informed written consent to proceed with treatment after reviewing the details related to the planned course of therapy. The consent form was witnessed and verified by the simulation staff.  Then, the patient was set-up in a stable reproducible  supine position for radiation therapy.  CT images were obtained.  Surface markings were placed.  The CT images were loaded into the planning software.  Then the target and avoidance structures were contoured.  Treatment planning then occurred.  The radiation prescription was entered and confirmed.  Then, I designed and supervised the construction of a total of 1 medically necessary complex treatment devices.  I have requested : Intensity Modulated Radiotherapy (IMRT) is medically necessary for this case for the following reason:  Small bowel sparing..  I have ordered:dose calc.  PLAN:  The patient will receive 45 Gy in 25 fractions followed by a brachytherapy boost to the vaginal cuff region  ________________________________  -----------------------------------  Billie Lade, PhD, MD

## 2013-03-29 ENCOUNTER — Encounter: Payer: Self-pay | Admitting: Gynecology

## 2013-03-29 ENCOUNTER — Ambulatory Visit: Payer: Medicare Other | Attending: Gynecology | Admitting: Gynecology

## 2013-03-29 VITALS — BP 158/92 | HR 86 | Temp 98.4°F | Resp 16 | Ht 62.0 in | Wt 145.6 lb

## 2013-03-29 DIAGNOSIS — C539 Malignant neoplasm of cervix uteri, unspecified: Secondary | ICD-10-CM | POA: Insufficient documentation

## 2013-03-29 DIAGNOSIS — Z90710 Acquired absence of both cervix and uterus: Secondary | ICD-10-CM | POA: Insufficient documentation

## 2013-03-29 DIAGNOSIS — Z9079 Acquired absence of other genital organ(s): Secondary | ICD-10-CM | POA: Insufficient documentation

## 2013-03-29 NOTE — Patient Instructions (Signed)
Return to see [place] in 9-10 weeks. HUMI return to full levels of activity.

## 2013-03-29 NOTE — Progress Notes (Signed)
Consult Note: Gyn-Onc   Jasmine Cooper 79 y.o. female  Chief Complaint  Patient presents with  . Endometrial cancer    Follow up    Assessment : Stage II squamous cell carcinoma of the endometrium. Good postoperative recovery. Patient may return to full levels of activity.  Plan:  The patient given the okay to return to full levels of activity. She will begin radiation therapy tomorrow. All plan on seeing the patient back again approximately a month after completing radiation therapy.  Interval History: The patient returns today for initial postoperative followup. Since her last visit she's done well. She denies any GI or GU symptoms pelvic pain pressure vaginal bleeding or discharge. She had a staging CT scan postoperatively which shows no evidence of metastatic disease. The patient has seen Dr. Roselind Messier and we will initiate radiation therapy tomorrow.  `HPI: Patient presented with postmenopausal bleeding in July 2014. A pelvic ultrasound showed a 24 mm endometrial stripe. D&C on 02/02/2013 revealing squamous cell carcinoma of the endometrium. The patient underwent a total abdominal hysterectomy bilateral salpingo-oophorectomy on October 7. Final pathology showed invasive moderately differentiated squamous cell carcinoma which invaded the entire myometrium and involving the uterine serosa. Focally involved the cervical stroma. Careful study of the entire specimen indicates that this indeed is an endometrial squamous cell carcinoma tubes ovaries and peritoneal washings were negative.  Because of high risk features I recommend the patient received whole pelvis radiation therapy. Prior to see radiation therapy she had a CT scan that showed no other evidence of metastatic disease.  Review of Systems:10 point review of systems is negative except as noted in interval history.   Vitals: Blood pressure 158/92, pulse 86, temperature 98.4 F (36.9 C), temperature source Oral, resp. rate 16,  height 5\' 2"  (1.575 m), weight 145 lb 9.6 oz (66.044 kg).  Physical Exam: General : The patient is a healthy woman in no acute distress.  HEENT: normocephalic, extraoccular movements normal; neck is supple without thyromegally  Lynphnodes: Supraclavicular and inguinal nodes not enlarged  Abdomen: Soft, non-tender, no ascites, no organomegally, no masses, no hernias, a Pfannenstiel incision is healing well.  Pelvic:  EGBUS: Normal  Urethra and bladder: Normal  Cervix and uterus: Surgically absent  Bimanual and rectovaginal exam revealed no masses induration or nodularity.  Lower extremities: No edema or varicosities. Normal range of motion      Allergies  Allergen Reactions  . Codeine Nausea And Vomiting  . Morphine And Related Nausea And Vomiting    Past Medical History  Diagnosis Date  . Complication of anesthesia     very sensitive to medication. hard to wake after anes several  yrs ago.  . Anemia 1960's  . Cancer     endometerial squamous cell    Past Surgical History  Procedure Laterality Date  . Dilitation & currettage/hystroscopy with versapoint resection N/A 02/02/2013    Procedure: DILATATION & CURETTAGE/HYSTEROSCOPY WITH VERSAPOINT RESECTION;  Surgeon: Genia Del, MD;  Location: [facility];  Service: Gynecology;  Laterality: N/A;  . Cataract extraction  2009  . Breast lumpectomy Left     benign cyst  . Appendectomy  1960's  . Abdominal hysterectomy N/A 02/14/2013    Procedure: HYSTERECTOMY TOTAL ABDOMINAL;  Surgeon: Jeannette Corpus, MD;  Location: [facility];  Service: Gynecology;  Laterality: N/A;  . Laparotomy N/A 02/14/2013    Procedure: EXPLORATORY LAPAROTOMY;  Surgeon: Jeannette Corpus, MD;  Location: [facility];  Service: Gynecology;  Laterality: N/A;  . Salpingoophorectomy Bilateral  02/14/2013    Procedure: BILATERAL SALPINGO OOPHORECTOMY;  Surgeon: Jeannette Corpus, MD;  Location: [facility];  Service: Gynecology;  Laterality: Bilateral;  .  Tonsillectomy      childhood    Current Outpatient Prescriptions  Medication Sig Dispense Refill  . ASPIRIN PO Take by mouth as needed.      Marland Kitchen ibuprofen (ADVIL,MOTRIN) 200 MG tablet Take 400 mg by mouth every 6 (six) hours as needed for pain.      . Multiple Vitamin (MULTIVITAMIN WITH MINERALS) TABS tablet Take 1 tablet by mouth daily.       No current facility-administered medications for this visit.    History   Social History  . Marital Status: Widowed    Spouse Name: N/A    Number of Children: 4  . Years of Education: N/A   Occupational History  . Retired    Social History Main Topics  . Smoking status: Former Smoker -- 1.00 packs/day for 30 years    Types: Cigarettes    Quit date: 05/11/1980  . Smokeless tobacco: Never Used  . Alcohol Use: 0.6 oz/week    1 Glasses of wine per week     Comment: rarely  . Drug Use: No  . Sexual Activity: Not on file   Other Topics Concern  . Not on file   Social History Narrative  . No narrative on file    Family History  Problem Relation Age of Onset  . Cancer Maternal Grandmother   . Breast cancer Daughter     2002/      Jeannette Corpus, MD 03/29/2013, 2:47 PM

## 2013-03-30 ENCOUNTER — Ambulatory Visit
Admission: RE | Admit: 2013-03-30 | Discharge: 2013-03-30 | Disposition: A | Discharge status: Home / Self Care | Payer: Medicare Other | Source: Ambulatory Visit | Attending: Radiation Oncology | Admitting: Radiation Oncology

## 2013-03-30 NOTE — Progress Notes (Signed)
Jasmine Cooper here for post sim education.  She was given the Radiation Therapy and You book and reviewed the potential side effects of treatment including pain, diarrhea, fatigue, skin changes, hair loss and bladder changes.  She was oriented to the clinic and was educated about under treat day with Dr. Roselind Messier on Tuesday's.  She was provided with my business card and advised to call with any questions or concerns.

## 2013-03-31 ENCOUNTER — Ambulatory Visit
Admission: RE | Admit: 2013-03-31 | Discharge: 2013-03-31 | Disposition: A | Discharge status: Home / Self Care | Payer: Medicare Other | Source: Ambulatory Visit | Attending: Radiation Oncology | Admitting: Radiation Oncology

## 2013-04-02 ENCOUNTER — Ambulatory Visit
Admission: RE | Admit: 2013-04-02 | Discharge: 2013-04-02 | Disposition: A | Discharge status: Home / Self Care | Payer: Medicare Other | Source: Ambulatory Visit | Attending: Radiation Oncology | Admitting: Radiation Oncology

## 2013-04-03 ENCOUNTER — Ambulatory Visit
Admission: RE | Admit: 2013-04-03 | Discharge: 2013-04-03 | Disposition: A | Discharge status: Home / Self Care | Payer: Medicare Other | Source: Ambulatory Visit | Attending: Radiation Oncology | Admitting: Radiation Oncology

## 2013-04-04 ENCOUNTER — Ambulatory Visit
Admission: RE | Admit: 2013-04-04 | Discharge: 2013-04-04 | Disposition: A | Discharge status: Home / Self Care | Payer: Medicare Other | Source: Ambulatory Visit | Attending: Radiation Oncology | Admitting: Radiation Oncology

## 2013-04-04 VITALS — BP 167/84 | HR 76 | Temp 98.1°F | Ht 62.0 in | Wt 146.5 lb

## 2013-04-04 DIAGNOSIS — C541 Malignant neoplasm of endometrium: Secondary | ICD-10-CM

## 2013-04-04 NOTE — Progress Notes (Signed)
[facility]    Radiation Oncology [address] [place]     Maryln Gottron, M.D. [place], [place] [address]               Billie Lade, M.D., Ph.D. Phone: [phone]      Molli Hazard A. Kathrynn Running, M.D. Fax: [phone]      Radene Gunning, M.D., Ph.D.         Lurline Hare, M.D.         Grayland Jack, M.D Weekly Treatment Management Note  Name: Jasmine Cooper     MRN: [id number]        CSN: [id number] Date: 04/04/2013      DOB: July 17, 1929  CC: Jasmine Fiedler, MD         [name]    Status: Outpatient  Diagnosis: The encounter diagnosis was Endometrial cancer.  Current Dose: 9 Gy  Current Fraction: 5  Planned Dose: 45 GY  Narrative: Jasmine Cooper was seen today for weekly treatment management. The chart was checked and MVCT  were reviewed. She is tolerating her treatments well at this time. She has noticed some very mild queasiness. I offered medication but at this point she feels comfortable without any medicine for this issue.  Codeine and Morphine and related Current Outpatient Prescriptions  Medication Sig Dispense Refill  . ASPIRIN PO Take 325 mg by mouth as needed.       . Multiple Vitamin (MULTIVITAMIN WITH MINERALS) TABS tablet Take 1 tablet by mouth daily.      Marland Kitchen ibuprofen (ADVIL,MOTRIN) 200 MG tablet Take 400 mg by mouth every 6 (six) hours as needed for pain.       No current facility-administered medications for this encounter.   Labs:  Lab Results  Component Value Date   WBC 9.4 03/21/2013   HGB 14.7 03/21/2013   HCT 44.1 03/21/2013   MCV 89.5 03/21/2013   PLT 256 03/21/2013   Lab Results  Component Value Date   CREATININE 0.9 03/21/2013   BUN 17.2 03/21/2013   NA 143 03/21/2013   K 4.0 03/21/2013   CL 101 02/15/2013   CO2 26 03/21/2013   Lab Results  Component Value Date   ALT 16 02/09/2013   AST 24 02/09/2013   BILITOT 0.3 02/09/2013    Physical Examination:  Filed Vitals:   04/04/13 1351  BP: 167/84  Pulse:  76  Temp: 98.1 F (36.7 C)    Wt Readings from Last 3 Encounters:  04/04/13 146 lb 8 oz (66.452 kg)  03/29/13 145 lb 9.6 oz (66.044 kg)  03/21/13 145 lb (65.772 kg)     Lungs - Normal respiratory effort, chest expands symmetrically. Lungs are clear to auscultation, no crackles or wheezes.  Heart has regular rhythm and rate  Abdomen is soft and non tender with normal bowel sounds  Assessment:  Patient tolerating treatments well  Plan: Continue treatment per original radiation prescription

## 2013-04-04 NOTE — Progress Notes (Signed)
Jasmine Cooper here for weekly under treat visit.  She has had 5 fractions to her pelvis.  She denies pain.  She reports that her stomach has felt "upset" since yestarday.  She denies vaginal bleeding/discharge and skin changes.

## 2013-04-05 ENCOUNTER — Ambulatory Visit
Admission: RE | Admit: 2013-04-05 | Discharge: 2013-04-05 | Disposition: A | Discharge status: Home / Self Care | Payer: Medicare Other | Source: Ambulatory Visit | Attending: Radiation Oncology | Admitting: Radiation Oncology

## 2013-04-10 ENCOUNTER — Ambulatory Visit
Admission: RE | Admit: 2013-04-10 | Discharge: 2013-04-10 | Disposition: A | Discharge status: Home / Self Care | Payer: Medicare Other | Source: Ambulatory Visit | Attending: Radiation Oncology | Admitting: Radiation Oncology

## 2013-04-11 ENCOUNTER — Ambulatory Visit
Admission: RE | Admit: 2013-04-11 | Discharge: 2013-04-11 | Disposition: A | Discharge status: Home / Self Care | Payer: Medicare Other | Source: Ambulatory Visit | Attending: Radiation Oncology | Admitting: Radiation Oncology

## 2013-04-11 VITALS — BP 174/88 | HR 70 | Temp 97.8°F | Ht 62.0 in | Wt 143.8 lb

## 2013-04-11 DIAGNOSIS — C541 Malignant neoplasm of endometrium: Secondary | ICD-10-CM

## 2013-04-11 NOTE — Progress Notes (Signed)
Jasmine Cooper here for weekly under treat visit.  She has had 8 fractions to her pelvis.  She denies pain.  She had one spell of fatigue/weakness yesterday but feels fine now.  She denies any bladder changes, diarrhea or vaginal bleeding.

## 2013-04-11 NOTE — Progress Notes (Signed)
[facility]    Radiation Oncology [address] [place]     Maryln Gottron, M.D. [place], [place] [address]               Billie Lade, M.D., Ph.D. Phone: [phone]      Molli Hazard A. Kathrynn Running, M.D. Fax: [phone]      Radene Gunning, M.D., Ph.D.         Lurline Hare, M.D.         Grayland Jack, M.D Weekly Treatment Management Note  Name: Jasmine Cooper     MRN: [id number]        CSN: [id number] Date: 04/11/2013      DOB: 1930/04/18  CC: [name], MD         [name]    Status: Outpatient  Diagnosis: The encounter diagnosis was Endometrial cancer.  Current Dose: 14.4 Gy  Current Fraction: 8  Planned Dose: 45 Gy  Narrative: Jasmine Cooper was seen today for weekly treatment management. The chart was checked and MVCT  were reviewed. She continues to tolerate the treatments well. She denies any significant bowel or bladder issues. She had episode of fatigue yesterday but none on a consistent basis.  Codeine and Morphine and related Current Outpatient Prescriptions  Medication Sig Dispense Refill  . ASPIRIN PO Take 325 mg by mouth as needed.       Marland Kitchen ibuprofen (ADVIL,MOTRIN) 200 MG tablet Take 400 mg by mouth every 6 (six) hours as needed for pain.      . Multiple Vitamin (MULTIVITAMIN WITH MINERALS) TABS tablet Take 1 tablet by mouth daily.       No current facility-administered medications for this encounter.   Labs:  Lab Results  Component Value Date   WBC 9.4 03/21/2013   HGB 14.7 03/21/2013   HCT 44.1 03/21/2013   MCV 89.5 03/21/2013   PLT 256 03/21/2013   Lab Results  Component Value Date   CREATININE 0.9 03/21/2013   BUN 17.2 03/21/2013   NA 143 03/21/2013   K 4.0 03/21/2013   CL 101 02/15/2013   CO2 26 03/21/2013   Lab Results  Component Value Date   ALT 16 02/09/2013   AST 24 02/09/2013   BILITOT 0.3 02/09/2013    Physical Examination:  Filed Vitals:   04/11/13 0928  BP: 174/88  Pulse: 70  Temp: 97.8 F  (36.6 C)    Wt Readings from Last 3 Encounters:  04/11/13 143 lb 12.8 oz (65.227 kg)  04/04/13 146 lb 8 oz (66.452 kg)  03/29/13 145 lb 9.6 oz (66.044 kg)     Lungs - Normal respiratory effort, chest expands symmetrically. Lungs are clear to auscultation, no crackles or wheezes.  Heart has regular rhythm and rate  Abdomen is soft and non tender with normal bowel sounds  Assessment:  Patient tolerating treatments well  Plan: Continue treatment per original radiation prescription

## 2013-04-12 ENCOUNTER — Ambulatory Visit
Admission: RE | Admit: 2013-04-12 | Discharge: 2013-04-12 | Disposition: A | Discharge status: Home / Self Care | Payer: Medicare Other | Source: Ambulatory Visit | Attending: Radiation Oncology | Admitting: Radiation Oncology

## 2013-04-13 ENCOUNTER — Ambulatory Visit
Admission: RE | Admit: 2013-04-13 | Discharge: 2013-04-13 | Disposition: A | Discharge status: Home / Self Care | Payer: Medicare Other | Source: Ambulatory Visit | Attending: Radiation Oncology | Admitting: Radiation Oncology

## 2013-04-14 ENCOUNTER — Ambulatory Visit
Admission: RE | Admit: 2013-04-14 | Discharge: 2013-04-14 | Disposition: A | Discharge status: Home / Self Care | Payer: Medicare Other | Source: Ambulatory Visit | Attending: Radiation Oncology | Admitting: Radiation Oncology

## 2013-04-17 ENCOUNTER — Ambulatory Visit
Admission: RE | Admit: 2013-04-17 | Discharge: 2013-04-17 | Disposition: A | Discharge status: Home / Self Care | Payer: Medicare Other | Source: Ambulatory Visit | Attending: Radiation Oncology | Admitting: Radiation Oncology

## 2013-04-18 ENCOUNTER — Ambulatory Visit
Admission: RE | Admit: 2013-04-18 | Discharge: 2013-04-18 | Disposition: A | Discharge status: Home / Self Care | Payer: Medicare Other | Source: Ambulatory Visit | Attending: Radiation Oncology | Admitting: Radiation Oncology

## 2013-04-18 ENCOUNTER — Encounter: Payer: Self-pay | Admitting: Radiation Oncology

## 2013-04-18 VITALS — BP 152/78 | HR 64 | Temp 98.4°F | Resp 20 | Wt 144.4 lb

## 2013-04-18 DIAGNOSIS — C541 Malignant neoplasm of endometrium: Secondary | ICD-10-CM

## 2013-04-18 NOTE — Progress Notes (Signed)
Weekly rad txs 13  txs enodmetrium  Completed, no c/o bleding, dysuria, or discharge, or hematuria, no nausea, does have stomach upset,takes tums prn, regular bowel movements appetite better 8:54 AM

## 2013-04-18 NOTE — Progress Notes (Signed)
[facility]    Radiation Oncology [address] [place]     Maryln Gottron, M.D. [place], [place] [address]               Billie Lade, M.D., Ph.D. Phone: [phone]      Molli Hazard A. Kathrynn Running, M.D. Fax: [phone]      Radene Gunning, M.D., Ph.D.         Lurline Hare, M.D.         Grayland Jack, M.D Weekly Treatment Management Note  Name: Jasmine Cooper     MRN: [id number]        CSN: [id number] Date: 04/18/2013      DOB: Apr 05, 1930  CC: [name], MD         [name]    Status: Outpatient  Diagnosis: The encounter diagnosis was Endometrial cancer.  Current Dose: 23.4 Gy  Current Fraction: 13  Planned Dose: 45 Gy  Narrative: Earlee Herald Diel was seen today for weekly treatment management. The chart was checked and MVCT  were reviewed. She continues to tolerate the treatments well. She has noticed some mild belching but no bowel cramping or diarrhea. She does have some mild fatigue. She denies any blood in her stools or dysuria.  Codeine and Morphine and related  Current Outpatient Prescriptions  Medication Sig Dispense Refill  . ASPIRIN PO Take 325 mg by mouth as needed.       . calcium carbonate (OS-CAL) 1250 MG chewable tablet Chew 1 tablet by mouth as needed for heartburn. Upset stomach      . ibuprofen (ADVIL,MOTRIN) 200 MG tablet Take 400 mg by mouth every 6 (six) hours as needed for pain.      . Multiple Vitamin (MULTIVITAMIN WITH MINERALS) TABS tablet Take 1 tablet by mouth daily.       No current facility-administered medications for this encounter.    Physical Examination:  Filed Vitals:   04/18/13 0851  BP: 152/78  Pulse: 64  Temp: 98.4 F (36.9 C)  Resp: 20    Wt Readings from Last 3 Encounters:  04/18/13 144 lb 6.4 oz (65.499 kg)  04/11/13 143 lb 12.8 oz (65.227 kg)  04/04/13 146 lb 8 oz (66.452 kg)     Lungs - Normal respiratory effort, chest expands symmetrically. Lungs are clear to auscultation, no  crackles or wheezes.  Heart has regular rhythm and rate  Abdomen is soft and non tender with normal bowel sounds  Assessment:  Patient tolerating treatments well  Plan: Continue treatment per original radiation prescription

## 2013-04-19 ENCOUNTER — Ambulatory Visit
Admission: RE | Admit: 2013-04-19 | Discharge: 2013-04-19 | Disposition: A | Discharge status: Home / Self Care | Payer: Medicare Other | Source: Ambulatory Visit | Attending: Radiation Oncology | Admitting: Radiation Oncology

## 2013-04-20 ENCOUNTER — Ambulatory Visit
Admission: RE | Admit: 2013-04-20 | Discharge: 2013-04-20 | Disposition: A | Discharge status: Home / Self Care | Payer: Medicare Other | Source: Ambulatory Visit | Attending: Radiation Oncology | Admitting: Radiation Oncology

## 2013-04-21 ENCOUNTER — Ambulatory Visit
Admission: RE | Admit: 2013-04-21 | Discharge: 2013-04-21 | Disposition: A | Discharge status: Home / Self Care | Payer: Medicare Other | Source: Ambulatory Visit | Attending: Radiation Oncology | Admitting: Radiation Oncology

## 2013-04-24 ENCOUNTER — Ambulatory Visit
Admission: RE | Admit: 2013-04-24 | Discharge: 2013-04-24 | Disposition: A | Discharge status: Home / Self Care | Payer: Medicare Other | Source: Ambulatory Visit | Attending: Radiation Oncology | Admitting: Radiation Oncology

## 2013-04-25 ENCOUNTER — Ambulatory Visit
Admission: RE | Admit: 2013-04-25 | Discharge: 2013-04-25 | Disposition: A | Discharge status: Home / Self Care | Payer: Medicare Other | Source: Ambulatory Visit | Attending: Radiation Oncology | Admitting: Radiation Oncology

## 2013-04-25 VITALS — BP 173/84 | HR 61 | Temp 98.2°F | Ht 62.0 in | Wt 144.6 lb

## 2013-04-25 DIAGNOSIS — C541 Malignant neoplasm of endometrium: Secondary | ICD-10-CM

## 2013-04-25 NOTE — Progress Notes (Signed)
Jasmine Cooper has had 18 fractions to her pelvis.  She has occasional sharp pains/cramps in her abdomen.  She has noticed burning in her bladder with urination right after treatment that goes away in the afternoon.  She denies other bladder changes, skin irritation and diarrhea.

## 2013-04-25 NOTE — Progress Notes (Signed)
[facility]    Radiation Oncology [address] [place]     Maryln Gottron, M.D. [place], [place] [address]               Billie Lade, M.D., Ph.D. Phone: [phone]      Molli Hazard A. Kathrynn Running, M.D. Fax: [phone]      Radene Gunning, M.D., Ph.D.         Lurline Hare, M.D.         Grayland Jack, M.D Weekly Treatment Management Note  Name: Jasmine Cooper     MRN: [id number]        CSN: [id number] Date: 04/25/2013      DOB: 11-Mar-1930  CC: [name], MD         [name]    Status: Outpatient  Diagnosis: The encounter diagnosis was Endometrial cancer.  Current Dose: 32.4 Gy  Current Fraction: 18  Planned Dose: 45 Gy  Narrative: Jasmine Cooper was seen today for weekly treatment management. The chart was checked and MVCT  were reviewed. She continues to tolerate the treatments well. She occasionally will notice the crampy type pain after her radiation treatment but none on a consistent basis. She is starting to have some fatigue but is able to carry on her usual activities.  Codeine and Morphine and related  Current Outpatient Prescriptions  Medication Sig Dispense Refill  . ASPIRIN PO Take 325 mg by mouth as needed.       . calcium carbonate (OS-CAL) 1250 MG chewable tablet Chew 1 tablet by mouth as needed for heartburn. Upset stomach      . ibuprofen (ADVIL,MOTRIN) 200 MG tablet Take 400 mg by mouth every 6 (six) hours as needed for pain.      . Multiple Vitamin (MULTIVITAMIN WITH MINERALS) TABS tablet Take 1 tablet by mouth daily.       No current facility-administered medications for this encounter.    Physical Examination:  Filed Vitals:   04/25/13 0932  BP: 173/84  Pulse: 61  Temp: 98.2 F (36.8 C)    Wt Readings from Last 3 Encounters:  04/25/13 144 lb 9.6 oz (65.59 kg)  04/18/13 144 lb 6.4 oz (65.499 kg)  04/11/13 143 lb 12.8 oz (65.227 kg)     Lungs - Normal respiratory effort, chest expands symmetrically. Lungs  are clear to auscultation, no crackles or wheezes.  Heart has regular rhythm and rate  Abdomen is soft and non tender with normal bowel sounds  Assessment:  Patient tolerating treatments well  Plan: Continue treatment per original radiation prescription

## 2013-04-26 ENCOUNTER — Ambulatory Visit
Admission: RE | Admit: 2013-04-26 | Discharge: 2013-04-26 | Disposition: A | Discharge status: Home / Self Care | Payer: Medicare Other | Source: Ambulatory Visit | Attending: Radiation Oncology | Admitting: Radiation Oncology

## 2013-04-27 ENCOUNTER — Ambulatory Visit
Admission: RE | Admit: 2013-04-27 | Discharge: 2013-04-27 | Disposition: A | Discharge status: Home / Self Care | Payer: Medicare Other | Source: Ambulatory Visit | Attending: Radiation Oncology | Admitting: Radiation Oncology

## 2013-04-28 ENCOUNTER — Ambulatory Visit
Admission: RE | Admit: 2013-04-28 | Discharge: 2013-04-28 | Disposition: A | Discharge status: Home / Self Care | Payer: Medicare Other | Source: Ambulatory Visit | Attending: Radiation Oncology | Admitting: Radiation Oncology

## 2013-05-01 ENCOUNTER — Ambulatory Visit
Admission: RE | Admit: 2013-05-01 | Discharge: 2013-05-01 | Disposition: A | Discharge status: Home / Self Care | Payer: Medicare Other | Source: Ambulatory Visit | Attending: Radiation Oncology | Admitting: Radiation Oncology

## 2013-05-02 ENCOUNTER — Ambulatory Visit
Admission: RE | Admit: 2013-05-02 | Discharge: 2013-05-02 | Disposition: A | Discharge status: Home / Self Care | Payer: Medicare Other | Source: Ambulatory Visit | Attending: Radiation Oncology | Admitting: Radiation Oncology

## 2013-05-02 ENCOUNTER — Encounter: Payer: Self-pay | Admitting: Radiation Oncology

## 2013-05-02 VITALS — BP 176/64 | HR 64 | Temp 98.6°F | Resp 20 | Wt 143.7 lb

## 2013-05-02 DIAGNOSIS — C541 Malignant neoplasm of endometrium: Secondary | ICD-10-CM

## 2013-05-02 NOTE — Progress Notes (Signed)
[facility]    Radiation Oncology [address] [place]     Jasmine Cooper, M.D. [place], [place] [address]               Jasmine Cooper, M.D., Ph.D. Phone: [phone]      Jasmine Cooper, M.D. Fax: [phone]      Jasmine Cooper, M.D., Ph.D.         Jasmine Cooper, M.D.         Jasmine Cooper, M.D Weekly Treatment Management Note  Name: Jasmine Cooper     MRN: [id number]        CSN: [id number] Date: 05/02/2013      DOB: 1930/02/07  CC: Jasmine Fiedler, MD         Jasmine Cooper    Status: Outpatient  Diagnosis: The encounter diagnosis was Endometrial cancer.  Current Dose: 41.4 Gy  Current Fraction: 23  Planned Dose: 45 Gy  Narrative: Jasmine Cooper was seen today for weekly treatment management. The chart was checked and MVCT  were reviewed. Her only complaint at this time is mild fatigue.  Codeine and Morphine and related  Current Outpatient Prescriptions  Medication Sig Dispense Refill  . ASPIRIN PO Take 325 mg by mouth as needed.       . calcium carbonate (OS-CAL) 1250 MG chewable tablet Chew 1 tablet by mouth as needed for heartburn. Upset stomach      . ibuprofen (ADVIL,MOTRIN) 200 MG tablet Take 400 mg by mouth every 6 (six) hours as needed for pain.      . Multiple Vitamin (MULTIVITAMIN WITH MINERALS) TABS tablet Take 1 tablet by mouth daily.       No current facility-administered medications for this encounter.     Physical Examination:  Filed Vitals:   05/02/13 0917  BP: 176/64  Pulse: 64  Temp: 98.6 F (37 C)  Resp: 20    Wt Readings from Last 3 Encounters:  05/02/13 143 lb 11.2 oz (65.182 kg)  04/25/13 144 lb 9.6 oz (65.59 kg)  04/18/13 144 lb 6.4 oz (65.499 kg)     Lungs - Normal respiratory effort, chest expands symmetrically. Lungs are clear to auscultation, no crackles or wheezes.  Heart has regular rhythm and rate  Abdomen is soft and non tender with normal bowel sounds  Assessment:  Patient tolerating  treatments well  Plan: Continue treatment per original radiation prescription

## 2013-05-02 NOTE — Progress Notes (Signed)
Weekly rad txs 23/25 completed, no c/o pain, dysuria,no hematuria , regular bowels, appetite good, fatigue has set in since end of last Thursday,  9:19 AM

## 2013-05-03 ENCOUNTER — Ambulatory Visit
Admission: RE | Admit: 2013-05-03 | Discharge: 2013-05-03 | Disposition: A | Discharge status: Home / Self Care | Payer: Medicare Other | Source: Ambulatory Visit | Attending: Radiation Oncology | Admitting: Radiation Oncology

## 2013-05-05 ENCOUNTER — Ambulatory Visit
Admission: RE | Admit: 2013-05-05 | Discharge: 2013-05-05 | Disposition: A | Discharge status: Home / Self Care | Payer: Medicare Other | Source: Ambulatory Visit | Attending: Radiation Oncology | Admitting: Radiation Oncology

## 2013-05-08 ENCOUNTER — Ambulatory Visit: Payer: Medicare Other

## 2013-05-09 ENCOUNTER — Ambulatory Visit: Payer: Medicare Other

## 2013-05-10 ENCOUNTER — Ambulatory Visit: Payer: Medicare Other

## 2013-05-12 ENCOUNTER — Ambulatory Visit: Payer: Medicare Other

## 2013-05-14 ENCOUNTER — Encounter: Payer: Self-pay | Admitting: Radiation Oncology

## 2013-05-14 NOTE — Progress Notes (Signed)
  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   Her only complaint during radiation therapy was mild fatigue.  Plan: The patient will be evaluated for intracavitary brachytherapy treatments in early January.  -----------------------------------  Blair Promise, PhD, MD

## 2013-05-15 ENCOUNTER — Ambulatory Visit: Payer: Medicare Other

## 2013-05-16 ENCOUNTER — Ambulatory Visit: Payer: Medicare Other

## 2013-05-18 ENCOUNTER — Other Ambulatory Visit: Payer: Self-pay | Admitting: Radiation Oncology

## 2013-05-18 ENCOUNTER — Other Ambulatory Visit: Payer: Self-pay | Admitting: Oncology

## 2013-05-18 ENCOUNTER — Encounter: Payer: Self-pay | Admitting: Radiation Oncology

## 2013-05-18 DIAGNOSIS — C541 Malignant neoplasm of endometrium: Secondary | ICD-10-CM

## 2013-05-18 DIAGNOSIS — R300 Dysuria: Secondary | ICD-10-CM

## 2013-05-18 NOTE — Progress Notes (Signed)
  Radiation Oncology         [phone] ________________________________  Name: Jasmine Cooper MRN: [id number]  Date: 05/18/2013  DOB: 17-Jul-1929  Follow-Up Visit Note  CC: Milagros Evener, MD  No ref. provider found  Diagnosis:   Endometrial cancer  Interval Since Last Radiation:  10  days  Narrative:  Today I called the patient to discuss intracavitary brachytherapy treatments as part of management of her stage II endometrial cancer. Patient does have a lot of urinary frequency and some dysuria. This may be related to her external beam radiation therapy but this is a new finding. She will present to the lab Friday, January 9 for urinalysis culture and sensitivity. The patient would like this issue cleared up before considering intracavitary brachytherapy treatments.                              ____________________________________ Blair Promise, MD

## 2013-05-19 ENCOUNTER — Ambulatory Visit
Admission: RE | Admit: 2013-05-19 | Discharge: 2013-05-19 | Disposition: A | Discharge status: Home / Self Care | Payer: Medicare Other | Source: Ambulatory Visit | Attending: Radiation Oncology | Admitting: Radiation Oncology

## 2013-05-19 DIAGNOSIS — R300 Dysuria: Secondary | ICD-10-CM

## 2013-05-22 ENCOUNTER — Telehealth: Payer: Self-pay | Admitting: Oncology

## 2013-05-22 LAB — URINE CULTURE

## 2013-05-22 NOTE — Telephone Encounter (Signed)
Called Jasmine Cooper and left a message for her to call back regarding her urinalysis from Friday.

## 2013-05-22 NOTE — Telephone Encounter (Signed)
Patient called asked about results of urinalysis and culture from 05/19/13.

## 2013-05-23 ENCOUNTER — Telehealth: Payer: Self-pay | Admitting: Oncology

## 2013-05-23 ENCOUNTER — Other Ambulatory Visit: Payer: Self-pay | Admitting: Radiation Oncology

## 2013-05-23 MED ORDER — CIPROFLOXACIN HCL 500 MG PO TABS: 500.0000 mg | ORAL_TABLET | Freq: Two times a day (BID) | ORAL | Status: DC

## 2013-05-23 NOTE — Telephone Encounter (Signed)
Called Davene to let her know a prescription for cipro has been sent to [facility] on [address].  Azaela stated she has not taken cipro before.  She is going to pick it up today.

## 2013-05-29 ENCOUNTER — Telehealth: Payer: Self-pay | Admitting: Oncology

## 2013-05-29 NOTE — Telephone Encounter (Signed)
Called Jasmine Cooper back and let her know that Dr. Sondra Come would like to see her Thursday if she is still having the pelvic pain.  Jasmine Cooper said she would like to go ahead and make the appointment now.  Transferred her to Santiago Glad to schedule an appointment.

## 2013-05-29 NOTE — Telephone Encounter (Signed)
Jasmine Cooper called and said she is still having pain her pelvic area and is concerned her UTI has not gone away.  She is taking ciprofloxacin 500 mg BID.  Her last does will be tonight.  She said the pain "comes and goes and is pulsating." She denies hematuria and says her urine does not have a foul odor.  Advised her that I would call her back after speaking to Dr. Sondra Come.

## 2013-05-31 ENCOUNTER — Encounter: Payer: Self-pay | Admitting: Oncology

## 2013-06-01 ENCOUNTER — Encounter: Payer: Self-pay | Admitting: Radiation Oncology

## 2013-06-01 ENCOUNTER — Ambulatory Visit
Admission: RE | Admit: 2013-06-01 | Discharge: 2013-06-01 | Disposition: A | Discharge status: Home / Self Care | Payer: Medicare Other | Source: Ambulatory Visit | Attending: Radiation Oncology | Admitting: Radiation Oncology

## 2013-06-01 ENCOUNTER — Telehealth: Payer: Self-pay | Admitting: Oncology

## 2013-06-01 VITALS — BP 154/83 | HR 68 | Temp 97.8°F | Resp 16 | Wt 141.5 lb

## 2013-06-01 DIAGNOSIS — R300 Dysuria: Secondary | ICD-10-CM | POA: Insufficient documentation

## 2013-06-01 DIAGNOSIS — C549 Malignant neoplasm of corpus uteri, unspecified: Secondary | ICD-10-CM | POA: Insufficient documentation

## 2013-06-01 DIAGNOSIS — C541 Malignant neoplasm of endometrium: Secondary | ICD-10-CM

## 2013-06-01 LAB — URINALYSIS, MICROSCOPIC - CHCC
Bilirubin (Urine): NEGATIVE
Glucose: NEGATIVE mg/dL
KETONES: NEGATIVE mg/dL
Nitrite: NEGATIVE
SPECIFIC GRAVITY, URINE: 1.005 (ref 1.003–1.035)
Urobilinogen, UR: 0.2 mg/dL (ref 0.2–1)
pH: 6.5 (ref 4.6–8.0)

## 2013-06-01 NOTE — Progress Notes (Signed)
Patient reports constant pulsating bladder pain 5 on a scale of 0-10. Reports this pain has been present since one week following completion of radiation therapy on December 29. Reports that she finished cipro last Monday for UTI. Reports pain upon urination but, denies this is a burning pain. Denies vaginal odor, itching or discharge. Denies diarrhea. Scheduled to see Aldean Ast on Monday. Should follow up appointment with Dr. Sondra Come for next week be cancelled?

## 2013-06-01 NOTE — Progress Notes (Signed)
  Radiation Oncology         [phone] ________________________________  Name: Jasmine Cooper MRN: [id number]  Date: 06/01/2013  DOB: [birth year]  Follow-Up Visit Note  CC: Milagros Evener, MD  [name], Bill Salinas, MD  Diagnosis:   Endometrial cancer  Interval Since Last Radiation:  3  weeks  Narrative:  The patient returns today for routine follow-up.  Patient was found to have a urinary tract infection. She was placed on Cipro which she completed earlier this week.  She however continues to have what she describes as a pulsing sensation along her left groin area which radiates into the perineum. This is quite bothersome to the patient when it happens. It is intermittent but quite bothersome for her. This is not associated with urination. She denies any hematuria vaginal bleeding bowel complaints.  She denies any sensation of bladder spasms the                      ALLERGIES:  is allergic to codeine and morphine and related.  Meds: Current Outpatient Prescriptions  Medication Sig Dispense Refill  . calcium carbonate (OS-CAL) 1250 MG chewable tablet Chew 1 tablet by mouth as needed for heartburn. Upset stomach      . Multiple Vitamin (MULTIVITAMIN WITH MINERALS) TABS tablet Take 1 tablet by mouth daily.      . ASPIRIN PO Take 325 mg by mouth as needed.       Marland Kitchen ibuprofen (ADVIL,MOTRIN) 200 MG tablet Take 400 mg by mouth every 6 (six) hours as needed for pain.       No current facility-administered medications for this encounter.    Physical Findings: The patient is in no acute distress. Patient is alert and oriented.  weight is 141 lb 8 oz (64.184 kg). Her oral temperature is 97.8 F (36.6 C). Her blood pressure is 154/83 and her pulse is 68. Her respiration is 16 and oxygen saturation is 100%. . No palpable supraclavicular or axillary adenopathy. The lungs are clear to auscultation. The heart has a regular rhythm and rate. The abdomen is soft and nontender with normal bowel  sounds. There is no inguinal adenopathy appreciated. I do not patient at any pulsing sensation along the left colon. On pelvic examination the external genitalia are unremarkable. A speculum exam is performed. There is no because lesion noted in the vaginal vault or discharge. I do not appreciate any hernias with exam in the upright position.  Lab Findings: Lab Results  Component Value Date   WBC 9.4 03/21/2013   HGB 14.7 03/21/2013   HCT 44.1 03/21/2013   MCV 89.5 03/21/2013   PLT 256 03/21/2013      Radiographic Findings: No results found.  Impression:  She continues to have an unusual sensation in the pelvis region. Patient will present to the lab for repeat urinalysis to make sure her bladder infection has cleared.  Patient does have a remote history of renal lithiasis and she feels the sensation may be similar. I offered to order a plain x-ray of the abdomen/pelvis today but the patient would like to wait and see her gynecologist oncologist which is scheduled for January 26.  She does not wish to pursue intracavitary brachytherapy treatments at this time given her symptoms.  Plan:  Management pending gynecologic oncology evaluation on January 26.  ____________________________________ Blair Promise, MD

## 2013-06-01 NOTE — Telephone Encounter (Signed)
Gordan Payment and asked if she can stop by nursing to pick up a dialator on Monday.  She said that she would stop by nursing.

## 2013-06-02 LAB — URINE CULTURE

## 2013-06-05 ENCOUNTER — Ambulatory Visit: Payer: Medicare Other | Attending: Gynecology | Admitting: Gynecology

## 2013-06-05 ENCOUNTER — Encounter: Payer: Self-pay | Admitting: Gynecology

## 2013-06-05 ENCOUNTER — Encounter: Payer: Self-pay | Admitting: Oncology

## 2013-06-05 ENCOUNTER — Telehealth: Payer: Self-pay | Admitting: *Deleted

## 2013-06-05 VITALS — BP 182/76 | HR 62 | Temp 98.0°F | Resp 18 | Ht 62.4 in | Wt 141.9 lb

## 2013-06-05 DIAGNOSIS — Z9079 Acquired absence of other genital organ(s): Secondary | ICD-10-CM | POA: Insufficient documentation

## 2013-06-05 DIAGNOSIS — Z90710 Acquired absence of both cervix and uterus: Secondary | ICD-10-CM | POA: Insufficient documentation

## 2013-06-05 DIAGNOSIS — Z923 Personal history of irradiation: Secondary | ICD-10-CM | POA: Insufficient documentation

## 2013-06-05 DIAGNOSIS — Z87891 Personal history of nicotine dependence: Secondary | ICD-10-CM | POA: Insufficient documentation

## 2013-06-05 DIAGNOSIS — C541 Malignant neoplasm of endometrium: Secondary | ICD-10-CM

## 2013-06-05 DIAGNOSIS — C549 Malignant neoplasm of corpus uteri, unspecified: Secondary | ICD-10-CM | POA: Insufficient documentation

## 2013-06-05 DIAGNOSIS — R3989 Other symptoms and signs involving the genitourinary system: Secondary | ICD-10-CM | POA: Insufficient documentation

## 2013-06-05 NOTE — Progress Notes (Signed)
Consult Note: Gyn-Onc   Jasmine Cooper 80 y.o. female  Chief Complaint  Patient presents with  . Endometrial Cancer    Assessment : Stage II squamous cell carcinoma of the endometrium. The patient seems to tolerate radiation therapy well. New onset of periurethral pain which is intermittent.  Plan:  Given the significant amount of blood on urinalysis, we'll refer the patient to urology for further evaluation. She's given a prescription for Pyridium 200 mg 3 times a day in the interim hoping that this will help with her symptoms.  She return to see me in 3 months for surveillance.  Interval History: The patient returns today having completed radiation therapy. She apparently tolerated radiation well. However approximately a week after completing radiation therapy she developed pain in the urethra. She denies dysuria flank pain or fevers. During radiation therapy the patient did develop an Escherichia coli UTI which was treated with Cipro. A repeat urine culture last week was no growth although urinalysis showed a large amount of blood.  Patient denies any vaginal bleeding. She has no other GI or GU or pelvic symptoms.  `HPI: Patient presented with postmenopausal bleeding in July 2014. A pelvic ultrasound showed a 24 mm endometrial stripe. D&C on 02/02/2013 revealing squamous cell carcinoma of the endometrium. The patient underwent a total abdominal hysterectomy bilateral salpingo-oophorectomy on October 7. Final pathology showed invasive moderately differentiated squamous cell carcinoma which invaded the entire myometrium and involving the uterine serosa. Focally involved the cervical stroma. Careful study of the entire specimen indicates that this indeed is an endometrial squamous cell carcinoma tubes ovaries and peritoneal washings were negative.  Because of high risk features I recommend the patient received whole pelvis radiation therapy. Prior to see radiation therapy she had a CT  scan that showed no other evidence of metastatic disease.  Review of Systems:10 point review of systems is negative except as noted in interval history.   Vitals: Blood pressure 182/76, pulse 62, temperature 98 F (36.7 C), temperature source Oral, resp. rate 18, height 5' 2.4" (1.585 m), weight 141 lb 14.4 oz (64.365 kg).  Physical Exam: General : The patient is a healthy woman in no acute distress.  HEENT: normocephalic, extraoccular movements normal; neck is supple without thyromegally  Lynphnodes: Supraclavicular and inguinal nodes not enlarged  Abdomen: Soft, non-tender, no ascites, no organomegally, no masses, no hernias, a Pfannenstiel incision is healing well.  Pelvic:  EGBUS: Normal  Urethra and bladder: Normal. Specific palpation urethra does not elicit any pain. And the urethra meatus looks normal.  Cervix and uterus: Surgically absent  Bimanual and rectovaginal exam revealed no masses induration or nodularity.  Lower extremities: No edema or varicosities. Normal range of motion      Allergies  Allergen Reactions  . Codeine Nausea And Vomiting  . Morphine And Related Nausea And Vomiting    Past Medical History  Diagnosis Date  . Complication of anesthesia     very sensitive to medication. hard to wake after anes several  yrs ago.  . Anemia 1960's  . Cancer     endometerial squamous cell  . History of radiation therapy 03/30/13-05/08/13    45 Gy to pelvis    Past Surgical History  Procedure Laterality Date  . Dilitation & currettage/hystroscopy with versapoint resection N/A 02/02/2013    Procedure: DILATATION & CURETTAGE/HYSTEROSCOPY WITH VERSAPOINT RESECTION;  Surgeon: Princess Bruins, MD;  Location: [facility];  Service: Gynecology;  Laterality: N/A;  . Cataract extraction  2009  . Breast  lumpectomy Left     benign cyst  . Appendectomy  1960's  . Abdominal hysterectomy N/A 02/14/2013    Procedure: HYSTERECTOMY TOTAL ABDOMINAL;  Surgeon: Alvino Chapel, MD;  Location: [facility];  Service: Gynecology;  Laterality: N/A;  . Laparotomy N/A 02/14/2013    Procedure: EXPLORATORY LAPAROTOMY;  Surgeon: Alvino Chapel, MD;  Location: [facility];  Service: Gynecology;  Laterality: N/A;  . Salpingoophorectomy Bilateral 02/14/2013    Procedure: BILATERAL SALPINGO OOPHORECTOMY;  Surgeon: Alvino Chapel, MD;  Location: [facility];  Service: Gynecology;  Laterality: Bilateral;  . Tonsillectomy      childhood    Current Outpatient Prescriptions  Medication Sig Dispense Refill  . ASPIRIN PO Take 325 mg by mouth as needed.       . calcium carbonate (OS-CAL) 1250 MG chewable tablet Chew 1 tablet by mouth as needed for heartburn. Upset stomach      . ibuprofen (ADVIL,MOTRIN) 200 MG tablet Take 400 mg by mouth every 6 (six) hours as needed for pain.      . Multiple Vitamin (MULTIVITAMIN WITH MINERALS) TABS tablet Take 1 tablet by mouth daily.       No current facility-administered medications for this visit.    History   Social History  . Marital Status: Widowed    Spouse Name: N/A    Number of Children: 4  . Years of Education: N/A   Occupational History  . Retired    Social History Main Topics  . Smoking status: Former Smoker -- 1.00 packs/day for 30 years    Types: Cigarettes    Quit date: 05/11/1980  . Smokeless tobacco: Never Used  . Alcohol Use: 0.6 oz/week    1 Glasses of wine per week     Comment: rarely  . Drug Use: No  . Sexual Activity: Not on file   Other Topics Concern  . Not on file   Social History Narrative  . No narrative on file    Family History  Problem Relation Age of Onset  . Cancer Maternal Grandmother   . Breast cancer Daughter     2002/      Alvino Chapel, MD 06/05/2013, 10:58 AM

## 2013-06-05 NOTE — Telephone Encounter (Signed)
Call to [facility] with referral.Pt records faxed to [phone]. Pt scheduled to see Dr. Louis Meckel on Feb 9th at 1:30pm. Call to pt with appt for [facility], gave information and directions on how to get to Urology. Pt will be mailed a new pt packet. Pt verbalized understanding. No concerns at this time.

## 2013-06-05 NOTE — Patient Instructions (Addendum)
Follow up with Dr. Aldean Ast in 3 months for Pap smear.  You were given a prescription for Pyridium for the pain/discomfort in the bladder you have been feeling. You have been referred to [facility], and we will call you with the appt date/time. Please call our office with any concerns.

## 2013-06-05 NOTE — Progress Notes (Signed)
Jasmine Cooper came by the clinic to pick up her dialator.  Advised her to use it 3 times a week (Monday, Wednesday, Friday) and to apply water based lubricant first.  Advised her to use it 10 minutes and to clean with soap and water.  Also advised her that her urine culture results came back negative.

## 2013-06-08 ENCOUNTER — Telehealth: Payer: Self-pay | Admitting: *Deleted

## 2013-06-08 ENCOUNTER — Ambulatory Visit: Payer: Medicare Other | Admitting: Radiation Oncology

## 2013-06-08 NOTE — Telephone Encounter (Signed)
Copy of pt medical records re-faxed to [facility]

## 2013-08-18 ENCOUNTER — Other Ambulatory Visit (HOSPITAL_COMMUNITY)
Admission: RE | Admit: 2013-08-18 | Discharge: 2013-08-18 | Disposition: A | Discharge status: Home / Self Care | Payer: Medicare Other | Source: Ambulatory Visit | Attending: Gynecology | Admitting: Gynecology

## 2013-08-18 ENCOUNTER — Ambulatory Visit: Payer: Medicare Other | Attending: Gynecology | Admitting: Gynecology

## 2013-08-18 ENCOUNTER — Encounter: Payer: Self-pay | Admitting: Gynecology

## 2013-08-18 VITALS — BP 157/94 | HR 104 | Temp 98.8°F | Resp 16 | Ht 62.0 in | Wt 142.0 lb

## 2013-08-18 DIAGNOSIS — C541 Malignant neoplasm of endometrium: Secondary | ICD-10-CM

## 2013-08-18 DIAGNOSIS — Z124 Encounter for screening for malignant neoplasm of cervix: Secondary | ICD-10-CM | POA: Insufficient documentation

## 2013-08-18 DIAGNOSIS — Z87891 Personal history of nicotine dependence: Secondary | ICD-10-CM | POA: Insufficient documentation

## 2013-08-18 DIAGNOSIS — Z8542 Personal history of malignant neoplasm of other parts of uterus: Secondary | ICD-10-CM | POA: Insufficient documentation

## 2013-08-18 NOTE — Progress Notes (Signed)
Consult Note: Gyn-Onc   Jasmine Cooper 80 y.o. female  Chief Complaint  Patient presents with  . 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  Plan:   The patient will see Dr. Sondra Come in 3 months return to see [place] in 6 months. Pap smears are obtained today.  Interval History:    Patient returns today as previously scheduled. Since her last visit she's done very well. At her last visit she was having some hematuria and urethral pain. She tells me this resolved spontaneously although she did see a urologist.  She denies any GI or GU symptoms. Her functional status is excellent and she is working out 3 days a week.    `HPI: Patient presented with postmenopausal bleeding in July 2014. A pelvic ultrasound showed a 24 mm endometrial stripe. D&C on 02/02/2013 revealing squamous cell carcinoma of the endometrium. The patient underwent a total abdominal hysterectomy bilateral salpingo-oophorectomy on October 7. Final pathology showed invasive moderately differentiated squamous cell carcinoma which invaded the entire myometrium and involving the uterine serosa. Focally involved the cervical stroma. Careful study of the entire specimen indicates that this indeed is an endometrial squamous cell carcinoma tubes ovaries and peritoneal washings were negative.  Because of high risk features I recommend the patient received whole pelvis radiation therapy. Prior to radiation therapy she had a CT scan that showed no other evidence of metastatic disease.  Review of Systems:10 point review of systems is negative except as noted in interval history.   Vitals: Blood pressure 157/94, pulse 104, temperature 98.8 F (37.1 C), temperature source Oral, resp. rate 16, height 5\' 2"  (1.575 m), weight 142 lb (64.411 kg).  Physical Exam: General : The patient is a healthy woman in no acute distress.  HEENT: normocephalic,  extraoccular movements normal; neck is supple without thyromegally  Lynphnodes: Supraclavicular and inguinal nodes not enlarged  Abdomen: Soft, non-tender, no ascites, no organomegally, no masses, no hernias, a Pfannenstiel incision is healing well.  Pelvic:  EGBUS: Normal  Urethra and bladder: Normal.   Cervix and uterus: Surgically absent  Bimanual and rectovaginal exam revealed no masses induration or nodularity.  Lower extremities: No edema or varicosities. Normal range of motion      Allergies  Allergen Reactions  . Codeine Nausea And Vomiting  . Morphine And Related Nausea And Vomiting    Past Medical History  Diagnosis Date  . Complication of anesthesia     very sensitive to medication. hard to wake after anes several  yrs ago.  . Anemia 1960's  . Cancer     endometerial squamous cell  . History of radiation therapy 03/30/13-05/08/13    45 Gy to pelvis    Past Surgical History  Procedure Laterality Date  . Dilitation & currettage/hystroscopy with versapoint resection N/A 02/02/2013    Procedure: DILATATION & CURETTAGE/HYSTEROSCOPY WITH VERSAPOINT RESECTION;  Surgeon: Princess Bruins, MD;  Location: [facility];  Service: Gynecology;  Laterality: N/A;  . Cataract extraction  2009  . Breast lumpectomy Left     benign cyst  . Appendectomy  1960's  . Abdominal hysterectomy N/A 02/14/2013    Procedure: HYSTERECTOMY TOTAL ABDOMINAL;  Surgeon: Alvino Chapel, MD;  Location: [facility];  Service: Gynecology;  Laterality: N/A;  . Laparotomy N/A 02/14/2013    Procedure: EXPLORATORY LAPAROTOMY;  Surgeon: Alvino Chapel, MD;  Location: [facility];  Service: Gynecology;  Laterality:  N/A;  . Salpingoophorectomy Bilateral 02/14/2013    Procedure: BILATERAL SALPINGO OOPHORECTOMY;  Surgeon: Alvino Chapel, MD;  Location: [facility];  Service: Gynecology;  Laterality: Bilateral;  . Tonsillectomy      childhood    Current Outpatient Prescriptions  Medication Sig  Dispense Refill  . ASPIRIN PO Take 325 mg by mouth as needed.       . Multiple Vitamin (MULTIVITAMIN WITH MINERALS) TABS tablet Take 1 tablet by mouth daily.      Marland Kitchen ibuprofen (ADVIL,MOTRIN) 200 MG tablet Take 400 mg by mouth every 6 (six) hours as needed for pain.       No current facility-administered medications for this visit.    History   Social History  . Marital Status: Widowed    Spouse Name: N/A    Number of Children: 4  . Years of Education: N/A   Occupational History  . Retired    Social History Main Topics  . Smoking status: Former Smoker -- 1.00 packs/day for 30 years    Types: Cigarettes    Quit date: 05/11/1980  . Smokeless tobacco: Never Used  . Alcohol Use: 0.6 oz/week    1 Glasses of wine per week     Comment: rarely  . Drug Use: No  . Sexual Activity: Not on file   Other Topics Concern  . Not on file   Social History Narrative  . No narrative on file    Family History  Problem Relation Age of Onset  . Cancer Maternal Grandmother   . Breast cancer Daughter     2002/      Alvino Chapel, MD 08/18/2013, 3:04 PM

## 2013-08-18 NOTE — Addendum Note (Signed)
Addended by: Lucile Crater on: 08/18/2013 04:42 PM   Modules accepted: Orders

## 2013-08-18 NOTE — Patient Instructions (Signed)
Plan to see Dr. Sondra Come in July and Dr. Fermin Schwab in Oct 2015.  Please call for any questions or concerns.

## 2013-08-25 ENCOUNTER — Telehealth: Payer: Self-pay | Admitting: *Deleted

## 2013-08-25 NOTE — Telephone Encounter (Signed)
Notified pt pap smear results were normal. Pt verbalized understanding.

## 2013-08-25 NOTE — Telephone Encounter (Signed)
Message copied by Espiridion Supinski, Aletha Halim on Fri Aug 25, 2013  9:59 AM ------      Message from: CROSS, MELISSA D      Created: Thu Aug 24, 2013 12:57 PM       Please let her know that her pap smear is normal.       ----- Message -----         From: Lab in [facility] Interface         Sent: 08/23/2013   5:26 PM           To: Dorothyann Gibbs, NP                   ------

## 2013-08-31 ENCOUNTER — Ambulatory Visit: Payer: Medicare Other | Admitting: Radiation Oncology

## 2013-11-27 ENCOUNTER — Ambulatory Visit: Payer: Medicare Other | Admitting: Radiation Oncology

## 2013-12-04 ENCOUNTER — Other Ambulatory Visit (HOSPITAL_COMMUNITY)
Admission: RE | Admit: 2013-12-04 | Discharge: 2013-12-04 | Disposition: A | Discharge status: Home / Self Care | Payer: Medicare Other | Source: Ambulatory Visit | Attending: Radiation Oncology | Admitting: Radiation Oncology

## 2013-12-04 ENCOUNTER — Encounter: Payer: Self-pay | Admitting: Radiation Oncology

## 2013-12-04 ENCOUNTER — Ambulatory Visit
Admission: RE | Admit: 2013-12-04 | Discharge: 2013-12-04 | Disposition: A | Discharge status: Home / Self Care | Payer: Medicare Other | Source: Ambulatory Visit | Attending: Radiation Oncology | Admitting: Radiation Oncology

## 2013-12-04 VITALS — BP 156/114 | HR 109 | Temp 98.1°F | Ht 62.0 in | Wt 136.2 lb

## 2013-12-04 DIAGNOSIS — Z124 Encounter for screening for malignant neoplasm of cervix: Secondary | ICD-10-CM | POA: Diagnosis present

## 2013-12-04 DIAGNOSIS — C541 Malignant neoplasm of endometrium: Secondary | ICD-10-CM

## 2013-12-04 NOTE — Progress Notes (Signed)
  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  She is doing well and without complaints. She denies any vaginal bleeding pelvic pain hematuria or rectal bleeding. She has been somewhat inconsistent with using her vaginal dilator. Patient did see gynecologic oncology in April with a good report at that time.                              ALLERGIES:  is allergic to codeine and morphine and related.  Meds: Current Outpatient Prescriptions  Medication Sig Dispense Refill  . ASPIRIN PO Take 325 mg by mouth as needed.       . Multiple Vitamin (MULTIVITAMIN WITH MINERALS) TABS tablet Take 1 tablet by mouth daily.       No current facility-administered medications for this encounter.    Physical Findings: The patient is in no acute distress. Patient is alert and oriented.  height is 5\' 2"  (1.575 m) and weight is 136 lb 3.2 oz (61.78 kg). Her oral temperature is 98.1 F (36.7 C). Her blood pressure is 156/114 and her pulse is 109. Marland Kitchen  No palpable supraclavicular or axillary adenopathy. The lungs are clear to auscultation. The heart has a regular rhythm and rate. The abdomen is soft and nontender with normal bowel sounds. No inguinal adenopathy is appreciated. On pelvic examination the external genitalia are unremarkable. A speculum exam is performed. There are no mucosal lesions noted in the vaginal vault. A Pap smear was obtained of the proximal vagina. On bimanual and rectovaginal examination there no pelvic masses appreciated.  Lab Findings: Lab Results  Component Value Date   WBC 9.4 03/21/2013   HGB 14.7 03/21/2013   HCT  44.1 03/21/2013   MCV 89.5 03/21/2013   PLT 256 03/21/2013      Radiographic Findings: No results found.  Impression:  No evidence of recurrence on clinical exam today, Pap smear pending  Plan:  Routine followup in 6 months. In the interim the patient will be seen by gynecologic oncology.  ____________________________________ Blair Promise, MD

## 2013-12-04 NOTE — Progress Notes (Signed)
Jasmine Cooper here for follow up after treatment for endometrial cancer.  She denies pain, nausea, diarrhea, bladder issues, rectal/vaginal bleeding and fatigue.  She has lost 6 lbs since April because she has started working out 3 times a week.  Her bp on arrival was elevated at 170/105 with a heart rate of 118, when retaken it was 156/114 and 109 heart rate.

## 2013-12-06 LAB — CYTOLOGY - PAP

## 2013-12-12 ENCOUNTER — Telehealth: Payer: Self-pay | Admitting: Oncology

## 2013-12-12 NOTE — Telephone Encounter (Signed)
Called and left a voice mail message requesting a call back.

## 2013-12-14 NOTE — Telephone Encounter (Signed)
Called Reba again and told her that her pap smear results were good per Dr. Sondra Come.  Jadelyn verbalized understanding.

## 2014-02-16 ENCOUNTER — Ambulatory Visit: Payer: Medicare Other | Admitting: Gynecology

## 2014-03-09 ENCOUNTER — Ambulatory Visit: Payer: Medicare Other | Attending: Gynecology | Admitting: Gynecology

## 2014-03-09 ENCOUNTER — Encounter: Payer: Self-pay | Admitting: Gynecology

## 2014-03-09 ENCOUNTER — Other Ambulatory Visit (HOSPITAL_COMMUNITY)
Admission: RE | Admit: 2014-03-09 | Discharge: 2014-03-09 | Disposition: A | Discharge status: Home / Self Care | Payer: Medicare Other | Source: Ambulatory Visit | Attending: Gynecology | Admitting: Gynecology

## 2014-03-09 VITALS — BP 175/93 | HR 67 | Temp 98.4°F | Resp 18 | Wt 133.9 lb

## 2014-03-09 DIAGNOSIS — Z90710 Acquired absence of both cervix and uterus: Secondary | ICD-10-CM | POA: Insufficient documentation

## 2014-03-09 DIAGNOSIS — Z90722 Acquired absence of ovaries, bilateral: Secondary | ICD-10-CM | POA: Insufficient documentation

## 2014-03-09 DIAGNOSIS — Z01411 Encounter for gynecological examination (general) (routine) with abnormal findings: Secondary | ICD-10-CM | POA: Diagnosis present

## 2014-03-09 DIAGNOSIS — C541 Malignant neoplasm of endometrium: Secondary | ICD-10-CM | POA: Insufficient documentation

## 2014-03-09 DIAGNOSIS — Z87891 Personal history of nicotine dependence: Secondary | ICD-10-CM | POA: Insufficient documentation

## 2014-03-09 DIAGNOSIS — Z9079 Acquired absence of other genital organ(s): Secondary | ICD-10-CM | POA: Diagnosis not present

## 2014-03-09 DIAGNOSIS — Z923 Personal history of irradiation: Secondary | ICD-10-CM | POA: Diagnosis not present

## 2014-03-09 NOTE — Patient Instructions (Signed)
We will contact you with Paps or report. Return to see [place] in May 2016. Please obtain mammograms.

## 2014-03-09 NOTE — Progress Notes (Signed)
Consult Note: Gyn-Onc   Jasmine Cooper 80 y.o. female  Chief Complaint  Patient presents with  . 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  Plan:   The patient will see Dr. Sondra Come in January as previously scheduled. She will return to see me in May 2016.. Pap smears are obtained today. She is encouraged to obtain mammograms ( she has not had any in several years)  Interval History:    Patient returns today as previously scheduled. Since her last visit she's done very well. In the interval she has seen Dr. Sofie Hartigan. She denies any GI or GU symptoms. Her functional status is excellent and she is working out 3 days a week. 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    `HPI: Patient presented with postmenopausal bleeding in July 2014. A pelvic ultrasound showed a 24 mm endometrial stripe. D&C on 02/02/2013 revealing squamous cell carcinoma of the endometrium. The patient underwent a total abdominal hysterectomy bilateral salpingo-oophorectomy on February 14 2013.Marland Kitchen Final pathology showed invasive moderately differentiated squamous cell carcinoma which invaded the entire myometrium and involving the uterine serosa. Focally involved the cervical stroma. Careful study of the entire specimen indicates that this indeed is an endometrial squamous cell carcinoma tubes ovaries and peritoneal washings were negative.  Because of high risk features I recommend the patient received whole pelvis radiation therapy. Prior to radiation therapy she had a CT scan that showed no other evidence of metastatic disease.  Review of Systems:10 point review of systems is negative except as noted in interval history.   Vitals: Blood pressure 175/93, pulse 67, temperature 98.4 F (36.9 C), temperature source Oral, resp. rate 18, weight 133 lb 14.4 oz (60.737 kg).  Physical Exam: General : The patient is a  healthy woman in no acute distress.  HEENT: normocephalic, extraoccular movements normal; neck is supple without thyromegally  Lynphnodes: Supraclavicular and inguinal nodes not enlarged  Abdomen: Soft, non-tender, no ascites, no organomegally, no masses, no hernias, a Pfannenstiel incision is healing well.  Pelvic:  EGBUS: Normal  Urethra and bladder: Normal.   Cervix and uterus: Surgically absent  Bimanual and rectovaginal exam revealed no masses induration or nodularity.  Lower extremities: No edema or varicosities. Normal range of motion      Allergies  Allergen Reactions  . Codeine Nausea And Vomiting  . Morphine And Related Nausea And Vomiting    Past Medical History  Diagnosis Date  . Complication of anesthesia     very sensitive to medication. hard to wake after anes several  yrs ago.  . Anemia 1960's  . Cancer     endometerial squamous cell  . History of radiation therapy 03/30/13-05/08/13    45 Gy to pelvis    Past Surgical History  Procedure Laterality Date  . Dilitation & currettage/hystroscopy with versapoint resection N/A 02/02/2013    Procedure: DILATATION & CURETTAGE/HYSTEROSCOPY WITH VERSAPOINT RESECTION;  Surgeon: Princess Bruins, MD;  Location: [facility];  Service: Gynecology;  Laterality: N/A;  . Cataract extraction  2009  . Breast lumpectomy Left     benign cyst  . Appendectomy  1960's  . Abdominal hysterectomy N/A 02/14/2013    Procedure: HYSTERECTOMY TOTAL ABDOMINAL;  Surgeon: Alvino Chapel, MD;  Location: [facility];  Service: Gynecology;  Laterality: N/A;  . Laparotomy N/A 02/14/2013    Procedure: EXPLORATORY LAPAROTOMY;  Surgeon: Alvino Chapel, MD;  Location: [facility];  Service: Gynecology;  Laterality: N/A;  . Salpingoophorectomy Bilateral 02/14/2013    Procedure: BILATERAL SALPINGO OOPHORECTOMY;  Surgeon: Alvino Chapel, MD;  Location: [facility];  Service: Gynecology;  Laterality: Bilateral;  . Tonsillectomy      childhood     Current Outpatient Prescriptions  Medication Sig Dispense Refill  . Multiple Vitamin (MULTIVITAMIN WITH MINERALS) TABS tablet Take 1 tablet by mouth daily.      . ASPIRIN PO Take 325 mg by mouth as needed.        No current facility-administered medications for this visit.    History   Social History  . Marital Status: Widowed    Spouse Name: N/A    Number of Children: 4  . Years of Education: N/A   Occupational History  . Retired    Social History Main Topics  . Smoking status: Former Smoker -- 1.00 packs/day for 30 years    Types: Cigarettes    Quit date: 05/11/1980  . Smokeless tobacco: Never Used  . Alcohol Use: 0.6 oz/week    1 Glasses of wine per week     Comment: rarely  . Drug Use: No  . Sexual Activity: Not on file   Other Topics Concern  . Not on file   Social History Narrative  . No narrative on file    Family History  Problem Relation Age of Onset  . Cancer Maternal Grandmother   . Breast cancer Daughter     2002/      Alvino Chapel, MD 03/09/2014, 9:58 AM

## 2014-03-09 NOTE — Addendum Note (Signed)
Addended by: Lucile Crater on: 03/09/2014 10:36 AM   Modules accepted: Orders

## 2014-03-12 ENCOUNTER — Encounter: Payer: Self-pay | Admitting: Gynecology

## 2014-03-15 LAB — CYTOLOGY - PAP

## 2014-03-16 ENCOUNTER — Telehealth: Payer: Self-pay | Admitting: *Deleted

## 2014-03-16 NOTE — Telephone Encounter (Signed)
-----   Message from Dorothyann Gibbs, NP sent at 03/16/2014 10:00 AM EST ----- Please let her know that her pap smear was negative. ----- Message -----    From: Lab [facility] Interface    Sent: 03/15/2014   3:04 PM      To: Dorothyann Gibbs, NP

## 2014-03-16 NOTE — Telephone Encounter (Signed)
Notified pt pap smear was negative. No further concerns. Pt verbalized understanding

## 2014-05-21 ENCOUNTER — Other Ambulatory Visit: Payer: Self-pay | Admitting: Family Medicine

## 2014-05-21 ENCOUNTER — Ambulatory Visit
Admission: RE | Admit: 2014-05-21 | Discharge: 2014-05-21 | Disposition: A | Discharge status: Home / Self Care | Payer: Medicare Other | Source: Ambulatory Visit | Attending: Family Medicine | Admitting: Family Medicine

## 2014-05-21 DIAGNOSIS — W19XXXA Unspecified fall, initial encounter: Secondary | ICD-10-CM

## 2014-06-04 ENCOUNTER — Ambulatory Visit: Admission: RE | Admit: 2014-06-04 | Payer: Medicare Other | Source: Ambulatory Visit | Admitting: Radiation Oncology

## 2014-06-14 ENCOUNTER — Ambulatory Visit
Admission: RE | Admit: 2014-06-14 | Discharge: 2014-06-14 | Disposition: A | Discharge status: Home / Self Care | Payer: Medicare Other | Source: Ambulatory Visit | Attending: Radiation Oncology | Admitting: Radiation Oncology

## 2014-06-14 ENCOUNTER — Encounter: Payer: Self-pay | Admitting: Radiation Oncology

## 2014-06-14 ENCOUNTER — Other Ambulatory Visit (HOSPITAL_COMMUNITY)
Admission: RE | Admit: 2014-06-14 | Discharge: 2014-06-14 | Disposition: A | Discharge status: Home / Self Care | Payer: Medicare Other | Source: Ambulatory Visit | Attending: Radiation Oncology | Admitting: Radiation Oncology

## 2014-06-14 VITALS — BP 195/85 | HR 61 | Temp 98.1°F | Resp 12 | Wt 134.7 lb

## 2014-06-14 DIAGNOSIS — Z01411 Encounter for gynecological examination (general) (routine) with abnormal findings: Secondary | ICD-10-CM | POA: Insufficient documentation

## 2014-06-14 DIAGNOSIS — C541 Malignant neoplasm of endometrium: Secondary | ICD-10-CM

## 2014-06-14 NOTE — Progress Notes (Signed)
She is currently in no pain. Pt complains of fatigue .  Denies abnormal urinary or bowel symptoms . Pt reports vaginal symptoms of itching, denies vaginal discharge or vaginal bleeding. Reports occasional abdominal pain which she states is "gas pain."   BP 195/85 mmHg  Pulse 61  Temp(Src) 98.1 F (36.7 C) (Oral)  Resp 12  Wt 134 lb 11.2 oz (61.1 kg)  SpO2 100%

## 2014-06-14 NOTE — Progress Notes (Signed)
Radiation Oncology         [phone] ________________________________  Name: Jasmine Cooper MRN: [id number]  Date: 06/14/2014  DOB: [birth year]  Follow-Up Visit Note  CC: Milagros Evener, MD  [name], Bill Salinas, MD    ICD-9-CM ICD-10-CM   1. 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  She received postoperative radiation treatments directed at the pelvis area. The patient declined intracavitary brachytherapy treatments in light of her urinary symptoms after she completed her external beam treatments.  She was seen by Dr. Fermin Schwab in late October of last year with pelvic exam and Pap smear being unremarkable. She denies any further problems with dysuria or hematuria. She denies any rectal bleeding or diarrhea. Patient occasionally will have some abdominal cramping but not nausea.  She continues to use her vaginal dilator as recommended.                      ALLERGIES:  is allergic to codeine and morphine and related.  Meds: Current Outpatient Prescriptions  Medication Sig Dispense Refill  . ASPIRIN PO Take 325 mg by mouth as needed.     . Multiple Vitamin (MULTIVITAMIN WITH MINERALS) TABS tablet Take 1 tablet by mouth daily.     No current facility-administered medications for this encounter.    Physical Findings: The patient is in no acute distress. Patient is alert and oriented.  weight is 134 lb 11.2 oz (61.1 kg). Her oral temperature is 98.1 F (36.7 C). Her blood pressure is 195/85 and her pulse is 61. Her respiration is 12 and oxygen saturation is 100%. .  No palpable supraclavicular or axillary adenopathy. The lungs are clear to auscultation. The heart has a regular rhythm and rate. The abdomen is soft and nontender with normal bowel sounds. No inguinal adenopathy is appreciated.  On pelvic examination the external genitalia are unremarkable. A speculum exam is performed. There are no mucosal lesions noted in the vaginal vault. A Pap smear was obtained of the proximal vagina. On bimanual and rectovaginal examination there no pelvic masses appreciated.  Lab Findings: Lab Results  Component Value Date   WBC 9.4 03/21/2013   HGB 14.7 03/21/2013   HCT 44.1 03/21/2013   MCV 89.5 03/21/2013   PLT 256 03/21/2013    Radiographic Findings: Dg Ribs Unilateral W/chest Right  05/21/2014   CLINICAL DATA:  Status post fall, right lower anterior rib pain initial visit  EXAM: RIGHT RIBS AND CHEST - 3+ VIEW  COMPARISON:  PA and lateral chest of February 09, 2013  FINDINGS: The lungs are mildly hyperinflated. There is no evidence of a pulmonary contusion or pleural effusion or pneumothorax. The heart and pulmonary vascularity are normal. There is mild stable tortuosity of the descending thoracic aorta. The observed portions of the ribs exhibit diffuse osteopenia. A metallic BB was placed over the anterior costochondral junction of the lower right ribs. No acute fractures are demonstrated.  IMPRESSION: COPD. There is no evidence of a pulmonary contusion nor fracture the visualized portions of the right ribs.   Electronically Signed   By: David  Martinique   On: 05/21/2014 17:02    Impression:  No evidence of recurrence on clinical exam today, Pap smear pending  Plan:  Routine follow-up in November of this year. In the  interim the patient will be seen by Dr. Fermin Schwab in May. I recommended she follow-up with her primary care physician concerning her elevated blood pressure.  ____________________________________ Blair Promise, MD

## 2014-06-19 LAB — CYTOLOGY - PAP

## 2014-06-21 ENCOUNTER — Telehealth: Payer: Self-pay | Admitting: Oncology

## 2014-06-21 NOTE — Telephone Encounter (Signed)
Called Hortence and let her now that her pap smear was normal per Dr. Sondra Come.  She verbalized agreement.

## 2014-09-26 ENCOUNTER — Other Ambulatory Visit: Payer: Self-pay

## 2014-09-26 DIAGNOSIS — Z1231 Encounter for screening mammogram for malignant neoplasm of breast: Secondary | ICD-10-CM

## 2014-10-02 ENCOUNTER — Ambulatory Visit
Admission: RE | Admit: 2014-10-02 | Discharge: 2014-10-02 | Disposition: A | Discharge status: Home / Self Care | Payer: Medicare Other | Source: Ambulatory Visit

## 2014-10-02 DIAGNOSIS — Z1231 Encounter for screening mammogram for malignant neoplasm of breast: Secondary | ICD-10-CM

## 2014-10-05 ENCOUNTER — Ambulatory Visit: Payer: Medicare Other | Attending: Gynecology | Admitting: Gynecology

## 2014-10-05 ENCOUNTER — Encounter: Payer: Self-pay | Admitting: Gynecology

## 2014-10-05 ENCOUNTER — Other Ambulatory Visit (HOSPITAL_COMMUNITY)
Admission: RE | Admit: 2014-10-05 | Discharge: 2014-10-05 | Disposition: A | Discharge status: Home / Self Care | Payer: Medicare Other | Source: Ambulatory Visit | Attending: Gynecology | Admitting: Gynecology

## 2014-10-05 VITALS — BP 166/86 | HR 67 | Temp 98.6°F | Resp 16 | Ht 62.0 in | Wt 134.0 lb

## 2014-10-05 DIAGNOSIS — Z01411 Encounter for gynecological examination (general) (routine) with abnormal findings: Secondary | ICD-10-CM | POA: Diagnosis present

## 2014-10-05 DIAGNOSIS — Z8542 Personal history of malignant neoplasm of other parts of uterus: Secondary | ICD-10-CM

## 2014-10-05 DIAGNOSIS — C541 Malignant neoplasm of endometrium: Secondary | ICD-10-CM

## 2014-10-05 DIAGNOSIS — Z08 Encounter for follow-up examination after completed treatment for malignant neoplasm: Secondary | ICD-10-CM | POA: Diagnosis present

## 2014-10-05 DIAGNOSIS — Z8541 Personal history of malignant neoplasm of cervix uteri: Secondary | ICD-10-CM | POA: Diagnosis not present

## 2014-10-05 NOTE — Addendum Note (Signed)
Addended by: Christa See on: 10/05/2014 03:50 PM   Modules accepted: Orders

## 2014-10-05 NOTE — Progress Notes (Signed)
Consult Note: Gyn-Onc   Jasmine Cooper 81 y.o. female  Chief Complaint  Patient presents with  . 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  Plan:   The patient will see Dr. Sondra Come in November as previously scheduled. She will return to see me in May 2017.. Pap smears are obtained today.  Interval History:    Patient returns today as previously scheduled. Since her last visit she's done very well. In the interval she has seen Dr. Sondra Come. She denies any GI or GU symptoms. She denies any pelvic pain pressure vaginal bleeding or discharge. Her functional status is excellent and she is working out 3 days a week. She is very active in her church and is tending to her roses.    `HPI: Patient presented with postmenopausal bleeding in July 2014. A pelvic ultrasound showed a 24 mm endometrial stripe. D&C on 02/02/2013 revealing squamous cell carcinoma of the endometrium. The patient underwent a total abdominal hysterectomy bilateral salpingo-oophorectomy on February 14 2013.Marland Kitchen Final pathology showed invasive moderately differentiated squamous cell carcinoma which invaded the entire myometrium and involving the uterine serosa. Focally involved the cervical stroma. Careful study of the entire specimen indicates that this indeed is an endometrial squamous cell carcinoma tubes ovaries and peritoneal washings were negative.  Because of high risk features I recommend the patient received whole pelvis radiation therapy. Prior to radiation therapy she had a CT scan that showed no other evidence of metastatic disease.  Review of Systems:10 point review of systems is negative except as noted in interval history.   Vitals: Blood pressure 166/86, pulse 67, temperature 98.6 F (37 C), temperature source Oral, resp. rate 16, height 5\' 2"  (1.575 m), weight 134 lb (60.782 kg).  Physical Exam: General : The patient is a  healthy woman in no acute distress.  HEENT: normocephalic, extraoccular movements normal; neck is supple without thyromegally  Lynphnodes: Supraclavicular and inguinal nodes not enlarged  Abdomen: Soft, non-tender, no ascites, no organomegally, no masses, no hernias, a Pfannenstiel incision is healing well.  Pelvic:  EGBUS: Normal  Urethra and bladder: Normal.   Cervix and uterus: Surgically absent  Bimanual and rectovaginal exam revealed no masses induration or nodularity.  Lower extremities: No edema or varicosities. Normal range of motion      Allergies  Allergen Reactions  . Codeine Nausea And Vomiting  . Morphine And Related Nausea And Vomiting    Past Medical History  Diagnosis Date  . Complication of anesthesia     very sensitive to medication. hard to wake after anes several  yrs ago.  . Anemia 1960's  . Cancer     endometerial squamous cell  . History of radiation therapy 03/30/13-05/08/13    45 Gy to pelvis    Past Surgical History  Procedure Laterality Date  . Dilitation & currettage/hystroscopy with versapoint resection N/A 02/02/2013    Procedure: DILATATION & CURETTAGE/HYSTEROSCOPY WITH VERSAPOINT RESECTION;  Surgeon: Princess Bruins, MD;  Location: [facility];  Service: Gynecology;  Laterality: N/A;  . Cataract extraction  2009  . Breast lumpectomy Left     benign cyst  . Appendectomy  1960's  . Abdominal hysterectomy N/A 02/14/2013    Procedure: HYSTERECTOMY TOTAL ABDOMINAL;  Surgeon: Alvino Chapel, MD;  Location: [facility];  Service: Gynecology;  Laterality: N/A;  . Laparotomy N/A 02/14/2013    Procedure: EXPLORATORY LAPAROTOMY;  Surgeon: Alvino Chapel, MD;  Location: [facility];  Service: Gynecology;  Laterality: N/A;  . Salpingoophorectomy Bilateral 02/14/2013    Procedure: BILATERAL SALPINGO OOPHORECTOMY;  Surgeon: Alvino Chapel, MD;  Location: [facility];  Service: Gynecology;  Laterality: Bilateral;  . Tonsillectomy      childhood     Current Outpatient Prescriptions  Medication Sig Dispense Refill  . ASPIRIN PO Take 325 mg by mouth as needed.     . Multiple Vitamin (MULTIVITAMIN WITH MINERALS) TABS tablet Take 1 tablet by mouth daily.     No current facility-administered medications for this visit.    History   Social History  . Marital Status: Widowed    Spouse Name: N/A  . Number of Children: 4  . Years of Education: N/A   Occupational History  . Retired    Social History Main Topics  . Smoking status: Former Smoker -- 1.00 packs/day for 30 years    Types: Cigarettes    Quit date: 05/11/1980  . Smokeless tobacco: Never Used  . Alcohol Use: 0.6 oz/week    1 Glasses of wine per week     Comment: rarely  . Drug Use: No  . Sexual Activity: No   Other Topics Concern  . Not on file   Social History Narrative    Family History  Problem Relation Age of Onset  . Cancer Maternal Grandmother   . Breast cancer Daughter     2002/      Alvino Chapel, MD 10/05/2014, 10:53 AM

## 2014-10-05 NOTE — Patient Instructions (Signed)
Followup with Dr. Sondra Come as scheduled and see Dr. Fermin Schwab in May 2017. Please call us in January or February to schedule this appt or sooner with any questions or concerns.

## 2014-10-10 LAB — CYTOLOGY - PAP

## 2014-10-11 ENCOUNTER — Telehealth: Payer: Self-pay | Admitting: *Deleted

## 2014-10-11 NOTE — Telephone Encounter (Signed)
Patient notified of results as noted below by Joylene John, NP.

## 2014-10-11 NOTE — Telephone Encounter (Signed)
-----   Message from Dorothyann Gibbs, NP sent at 10/10/2014  4:04 PM EDT ----- Please let her know her pap was normal ----- Message -----    From: [facility]    Sent: 10/10/2014   1:49 PM      To: Dorothyann Gibbs, NP

## 2015-03-14 ENCOUNTER — Ambulatory Visit: Payer: Medicare Other | Admitting: Radiation Oncology

## 2015-04-11 ENCOUNTER — Other Ambulatory Visit (HOSPITAL_COMMUNITY)
Admission: RE | Admit: 2015-04-11 | Discharge: 2015-04-11 | Disposition: A | Discharge status: Home / Self Care | Payer: Medicare Other | Source: Ambulatory Visit | Attending: Radiation Oncology | Admitting: Radiation Oncology

## 2015-04-11 ENCOUNTER — Ambulatory Visit
Admission: RE | Admit: 2015-04-11 | Discharge: 2015-04-11 | Disposition: A | Discharge status: Home / Self Care | Payer: Medicare Other | Source: Ambulatory Visit | Attending: Radiation Oncology | Admitting: Radiation Oncology

## 2015-04-11 ENCOUNTER — Encounter: Payer: Self-pay | Admitting: Radiation Oncology

## 2015-04-11 VITALS — BP 161/86 | HR 73 | Temp 97.6°F | Resp 18 | Ht 62.0 in | Wt 134.2 lb

## 2015-04-11 DIAGNOSIS — Z01411 Encounter for gynecological examination (general) (routine) with abnormal findings: Secondary | ICD-10-CM | POA: Diagnosis present

## 2015-04-11 DIAGNOSIS — C541 Malignant neoplasm of endometrium: Secondary | ICD-10-CM

## 2015-04-11 NOTE — Progress Notes (Signed)
Jasmine Cooper is here for follow up.  She reports having pain in her lower back radiation to her legs which flared up after putting up Christmas decorations.  She reports she has had this on and off for years.  She will see her Chiropractor tomorrow.  She denies bladder issues except for occasional incontinence.  She denies having a bowel issues, vaginal/rectal bleeding or discharge.  She reports having a good appetite and occasional fatigue.  She is using her vaginal dilator once a week.  BP 161/86 mmHg  Pulse 73  Temp(Src) 97.6 F (36.4 C) (Oral)  Resp 18  Ht 5\' 2"  (1.575 m)  Wt 134 lb 3.2 oz (60.873 kg)  BMI 24.54 kg/m2  SpO2 97%   Wt Readings from Last 3 Encounters:  04/11/15 134 lb 3.2 oz (60.873 kg)  10/05/14 134 lb (60.782 kg)  06/14/14 134 lb 11.2 oz (61.1 kg)

## 2015-04-11 NOTE — Progress Notes (Signed)
  Radiation Oncology         [phone] ________________________________  Name: Jasmine Cooper MRN: [id number]  Date: 04/11/2015  DOB: 05-10-1930  Follow-Up Visit Note  CC: Aretta Nip, MD  [name], Bill Salinas, MD    ICD-9-CM ICD-10-CM   1. 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 November 20 through May 08, 2013 Site/dose:   Pelvis 45 Gy in 25 fractions  Narrative:  The patient returns today for routine follow-up. She reports having pain in her lower back to her legs which flared up after putting up Christmas decorations. She reports she has had this on and off for years. She will see her Chiropractor tomorrow. She denies bladder issues, except for occasional incontinence. She denies having any bowel issues, vaginal/rectal bleeding, or discharge. She reports having a good appetite and occasional fatigue. She is using her vaginal dilator once a week.  The last time she saw Dr. Fermin Schwab was on 10/05/14.  ALLERGIES:  is allergic to codeine and morphine and related.  Meds: Current Outpatient Prescriptions  Medication Sig Dispense Refill  . ASPIRIN PO Take 325 mg by mouth as needed.     Marland Kitchen ibuprofen (ADVIL,MOTRIN) 200 MG tablet Take 200 mg by mouth every 6 (six) hours as needed.    . latanoprost (XALATAN) 0.005 % ophthalmic solution     . Multiple Vitamin (MULTIVITAMIN WITH MINERALS) TABS tablet Take 1 tablet by mouth daily.     No current facility-administered medications for this encounter.    Physical Findings: The patient is in no acute distress. Patient is alert and oriented.  height is 5\' 2"  (1.575 m) and weight is 134 lb 3.2 oz (60.873 kg). Her oral temperature is 97.6 F (36.4 C). Her blood pressure is 161/86 and her pulse is 73. Her respiration is 18 and oxygen saturation is 97%. .  No palpable  supraclavicular or axillary adenopathy. The lungs are clear to auscultation. The heart has a regular rhythm and rate. The abdomen is soft and nontender with normal bowel sounds. No inguinal adenopathy is appreciated. On pelvic examination the external genitalia are unremarkable. A speculum exam is performed. There are no mucosal lesions noted in the vaginal vault. A Pap smear was obtained of the proximal vagina. On bimanual and rectovaginal examination there no pelvic masses appreciated.  Lab Findings: Lab Results  Component Value Date   WBC 9.4 03/21/2013   HGB 14.7 03/21/2013   HCT 44.1 03/21/2013   MCV 89.5 03/21/2013   PLT 256 03/21/2013    Radiographic Findings: No results found.  Impression:  No evidence of recurrence on clinical exam today, Pap smear pending.  Plan:  Routine follow-up in one year. In the interim the patient will be seen by Dr. Fermin Schwab in 6 months.  ____________________________________  Blair Promise, PhD, MD  This document serves as a record of services personally performed by Gery Pray, MD. It was created on his behalf by Darcus Austin, a trained medical scribe. The creation of this record is based on the scribe's personal observations and the provider's statements to them. This document has been checked and approved by the attending provider.

## 2015-04-12 LAB — CYTOLOGY - PAP

## 2015-04-15 ENCOUNTER — Telehealth: Payer: Self-pay | Admitting: Oncology

## 2015-04-15 NOTE — Telephone Encounter (Signed)
Called Joni and advised her of the good results on her pap smear per Dr. Sondra Come.  She verbalized agreement and understanding.

## 2015-04-19 ENCOUNTER — Other Ambulatory Visit: Payer: Self-pay | Admitting: Chiropractic Medicine

## 2015-04-19 ENCOUNTER — Ambulatory Visit
Admission: RE | Admit: 2015-04-19 | Discharge: 2015-04-19 | Disposition: A | Discharge status: Home / Self Care | Payer: Medicare Other | Source: Ambulatory Visit | Attending: Chiropractic Medicine | Admitting: Chiropractic Medicine

## 2015-04-19 DIAGNOSIS — M545 Low back pain: Secondary | ICD-10-CM

## 2015-05-10 ENCOUNTER — Emergency Department (HOSPITAL_COMMUNITY)
Admission: EM | Admit: 2015-05-10 | Discharge: 2015-05-10 | Discharge status: Absent without leave | Payer: Medicare Other

## 2015-05-15 ENCOUNTER — Non-Acute Institutional Stay (SKILLED_NURSING_FACILITY): Payer: Medicare Other | Admitting: Internal Medicine

## 2015-05-15 DIAGNOSIS — R531 Weakness: Secondary | ICD-10-CM

## 2015-05-15 DIAGNOSIS — K5901 Slow transit constipation: Secondary | ICD-10-CM

## 2015-05-15 DIAGNOSIS — S32402S Unspecified fracture of left acetabulum, sequela: Secondary | ICD-10-CM

## 2015-05-15 DIAGNOSIS — IMO0001 Reserved for inherently not codable concepts without codable children: Secondary | ICD-10-CM

## 2015-05-15 DIAGNOSIS — I82402 Acute embolism and thrombosis of unspecified deep veins of left lower extremity: Secondary | ICD-10-CM

## 2015-05-15 DIAGNOSIS — M792 Neuralgia and neuritis, unspecified: Secondary | ICD-10-CM | POA: Diagnosis not present

## 2015-05-15 DIAGNOSIS — M5442 Lumbago with sciatica, left side: Secondary | ICD-10-CM

## 2015-05-15 DIAGNOSIS — N289 Disorder of kidney and ureter, unspecified: Secondary | ICD-10-CM

## 2015-05-15 DIAGNOSIS — R030 Elevated blood-pressure reading, without diagnosis of hypertension: Secondary | ICD-10-CM

## 2015-05-15 NOTE — Progress Notes (Signed)
Patient ID: Lyasia [place], female   DOB: [birth year], 82 y.o.   MRN: [id number]     [facility]   PCP: Aretta Nip, MD  Code Status: full code  Allergies  Allergen Reactions  . Codeine Nausea And Vomiting  . Morphine And Related Nausea And Vomiting    Chief Complaint  Patient presents with  . New Admit To SNF     HPI:  82 y.o. patient is here for short term rehabilitation post hospital admission from 05/10/15-05/14/15 with increasing low back pain radiating to her left leg and inability to bear weight. MRI revealed bilateral sacral insufficiency fracture and small non displaced fracture along superior medial left acetabulum. She was also diagnosed with left posterior tibial and peroneal vein DVT. Orthopedic was consulted and recommended medical management and TDWB to LLE for now. She is seen in her room today. Her pain is under control with current pain regimen. She has been constipated and had a small bowel movement yesterday post suppository in the hospital.   Review of Systems:  Constitutional: Negative for fever, chills HENT: Negative for headache, congestion, nasal discharge, difficulty swallowing.   Eyes: Negative for eye pain, blurred vision, double vision and discharge.  Respiratory: Negative for cough, shortness of breath and wheezing.   Cardiovascular: Negative for chest pain, palpitations, leg swelling.  Gastrointestinal: Negative for heartburn, nausea, vomiting, abdominal pain. Passing flatus. Genitourinary: Negative for dysuria  Musculoskeletal: Negative for falls in the facility Skin: Negative for itching, rash.  Neurological: Negative for dizziness, tingling, focal weakness Psychiatric/Behavioral: Negative for depression   Past Medical History  Diagnosis Date  . Complication of anesthesia     very sensitive to medication. hard to wake after anes several  yrs ago.  . Anemia 1960's  . Cancer ([place])     endometerial  squamous cell  . History of radiation therapy 03/30/13-05/08/13    45 Gy to pelvis   Past Surgical History  Procedure Laterality Date  . Dilitation & currettage/hystroscopy with versapoint resection N/A 02/02/2013    Procedure: DILATATION & CURETTAGE/HYSTEROSCOPY WITH VERSAPOINT RESECTION;  Surgeon: Princess Bruins, MD;  Location: [facility];  Service: Gynecology;  Laterality: N/A;  . Cataract extraction  2009  . Breast lumpectomy Left     benign cyst  . Appendectomy  1960's  . Abdominal hysterectomy N/A 02/14/2013    Procedure: HYSTERECTOMY TOTAL ABDOMINAL;  Surgeon: Alvino Chapel, MD;  Location: [facility];  Service: Gynecology;  Laterality: N/A;  . Laparotomy N/A 02/14/2013    Procedure: EXPLORATORY LAPAROTOMY;  Surgeon: Alvino Chapel, MD;  Location: [facility];  Service: Gynecology;  Laterality: N/A;  . Salpingoophorectomy Bilateral 02/14/2013    Procedure: BILATERAL SALPINGO OOPHORECTOMY;  Surgeon: Alvino Chapel, MD;  Location: [facility];  Service: Gynecology;  Laterality: Bilateral;  . Tonsillectomy      childhood   Social History:   reports that she quit smoking about 35 years ago. Her smoking use included Cigarettes. She has a 30 pack-year smoking history. She has never used smokeless tobacco. She reports that she drinks about 0.6 oz of alcohol per week. She reports that she does not use illicit drugs.  Family History  Problem Relation Age of Onset  . Cancer Maternal Grandmother   . Breast cancer Daughter     2002/    Medications:   Medication List       This list is accurate as of: 05/15/15 12:37 PM.  Always use your most recent  med list.               ASPIRIN PO  Take 500 mg by mouth every 6 (six) hours as needed.     bisacodyl 10 MG suppository  Commonly known as:  DULCOLAX  Place 10 mg rectally daily as needed for moderate constipation.     diazepam 5 MG tablet  Commonly known as:  VALIUM  Take 5 mg by mouth every 6 (six) hours as needed for  anxiety.     ELIQUIS 5 MG Tabs tablet  Generic drug:  apixaban  Take 5 mg by mouth 2 (two) times daily. 10 mg bid until 05/13/15 and 5 mg bid for 11 weeks after     gabapentin 100 MG capsule  Commonly known as:  NEURONTIN  Take 100 mg by mouth 3 (three) times daily.     HYDROcodone-acetaminophen 5-325 MG tablet  Commonly known as:  NORCO/VICODIN  Take 1 tablet by mouth every 4 (four) hours as needed for moderate pain.     ibuprofen 200 MG tablet  Commonly known as:  ADVIL,MOTRIN  Take 200 mg by mouth every 6 (six) hours as needed.     latanoprost 0.005 % ophthalmic solution  Commonly known as:  XALATAN  Place 1 drop into both eyes.     multivitamin with minerals Tabs tablet  Take 1 tablet by mouth daily.     polyethylene glycol packet  Commonly known as:  MIRALAX / GLYCOLAX  Take 17 g by mouth daily.     predniSONE 20 MG tablet  Commonly known as:  DELTASONE  Take 40 mg by mouth daily with breakfast.         Physical Exam: Filed Vitals:   05/15/15 1233  BP: 167/96  Pulse: 71  Temp: 97 F (36.1 C)  Resp: 18  SpO2: 96%    General- elderly female, well built, in no acute distress Head- normocephalic, atraumatic Nose- no maxillary or frontal sinus tenderness, no nasal discharge Throat- moist mucus membrane Eyes- PERRLA, EOMI, no pallor, no icterus, no discharge, normal conjunctiva, normal sclera Neck- no cervical lymphadenopathy Cardiovascular- normal s1,s2, no murmurs, palpable dorsalis pedis and radial pulses, trace leg edema Respiratory- bilateral clear to auscultation, no wheeze, no rhonchi, no crackles, no use of accessory muscles Abdomen- bowel sounds present, soft, non tender Musculoskeletal- able to move all 4 extremities, generalized weakness, limited ROM to LLE Neurological- no focal deficit, alert and oriented to person, place and time Skin- warm and dry Psychiatry- normal mood and affect    Labs reviewed: 05/12/15 na 141, k 4.6, bun 30, cr  1.10 05/10/15 wbc 8.2, hb 14.5, plt 234, cr 0.84   Assessment/Plan  Generalized weakness Will have her work with physical therapy and occupational therapy team to help with gait training and muscle strengthening exercises.fall precautions. Skin care. Encourage to be out of bed.   Left acetabular nondisplaced fracture Continue norco 5-325 mg 1 tab q4h prn pain with prn tylenol. Discontinue ibuprofen. Continue valium 5 mg q6h prn muscle spasm. Will have patient work with PT/OT as tolerated to regain strength and restore function.  Fall precautions are in place. TDWB only 25% to LLE for now, has follow up with orthopedics.   Low back pain Improved. Continue prednisone 40 mg daily until 05/17/14. Continue prn pain medication  LLE DVT Continue eliquis 10 mg bid for now and monitor  Constipation On miralax daily and prn dulcolax suppository. Start senna s 2 tab qhs and continue miralax.  Add prune with her breakfast/ lunch and monitor. Maintain hydration  Impaired renal function On ibuprofen. Discontinue this. Monitor BMP  Elevated blood pressure No known hx of HTN. Monitor BP q shift for now. Her pain could be contributing some to elevated BP reading. denies chest pain, dyspnea, headache or abdominal pain. Monitor clinically. If > 3 reading > 140/90, consider inititiating antihypertensives  Neuropathic pain Continue gabapentin 100 mg tid for now and monitor   Goals of care: short term rehabilitation   Labs/tests ordered: cbc, cmp 05/20/15  Family/ staff Communication: reviewed care plan with patient and nursing supervisor    Blanchie Serve, MD  [place] [phone] (Monday-Friday 8 am - 5 pm) [phone] (afterhours)

## 2015-05-30 ENCOUNTER — Non-Acute Institutional Stay (SKILLED_NURSING_FACILITY): Payer: Medicare Other | Admitting: Adult Health

## 2015-05-30 ENCOUNTER — Encounter: Payer: Self-pay | Admitting: Adult Health

## 2015-05-30 DIAGNOSIS — S32402S Unspecified fracture of left acetabulum, sequela: Secondary | ICD-10-CM | POA: Diagnosis not present

## 2015-05-30 DIAGNOSIS — M5442 Lumbago with sciatica, left side: Secondary | ICD-10-CM

## 2015-05-30 DIAGNOSIS — K5901 Slow transit constipation: Secondary | ICD-10-CM

## 2015-05-30 DIAGNOSIS — R531 Weakness: Secondary | ICD-10-CM

## 2015-05-30 DIAGNOSIS — I82402 Acute embolism and thrombosis of unspecified deep veins of left lower extremity: Secondary | ICD-10-CM

## 2015-05-30 DIAGNOSIS — M792 Neuralgia and neuritis, unspecified: Secondary | ICD-10-CM

## 2015-05-30 NOTE — Progress Notes (Signed)
Patient ID: Jasmine Cooper, female   DOB: Sep 03, 1929, 82 y.o.   MRN: [id number]    DATE:  05/30/2015   MRN:  [id number]  BIRTHDAY: 04-04-1930  Facility:  Nursing Home Location:  [place] Room Number: 1006-2  LEVEL OF CARE:  SNF [phone])  Contact Information    Name [place] [phone]  [phone]   [name], [name]   [phone]   No name specified           Code Status History    Date Active Date Inactive Code Status Order ID Comments User Context   02/14/2013 12:27 PM 02/16/2013  4:28 PM Full Code [id number]  [name], MD Inpatient       Chief Complaint  Patient presents with  . Discharge Note    Generalized weakness, left acetabular nondisplaced fracture, low back pain, LLE DVT, constipation and neuropathy    HISTORY OF PRESENT ILLNESS:  This is an 82 year old female who is for discharge home with home health PT, OT, CNA and nursing. DME:  Bedside commode and rolling walker. She has been admitted to [facility] on 05/14/15 from [facility] with increasing low back pain radiating to her left leg and inability to bear weight. MRI revealed bilateral sacral insufficiency fracture and small nondisplaced fracture along the superior medial left acetabulum. She was also diagnosed with left posterior tibial and peroneal vein DVT. Orthopedic was consulted and recommended medical management. She followed up with orthopedic on 05/29/15 and was ordered to have 50% weightbearing with rolling walker on LLE.  Patient was admitted to this facility for short-term rehabilitation after the patient's recent hospitalization.  Patient has completed SNF rehabilitation and therapy has cleared the patient for discharge.   PAST MEDICAL HISTORY:  Past Medical History  Diagnosis Date  . Complication of anesthesia     very sensitive to medication. hard to wake after anes several  yrs ago.  .  Anemia 1960's  . Cancer ([place])     endometerial squamous cell  . History of radiation therapy 03/30/13-05/08/13    45 Gy to pelvis     CURRENT MEDICATIONS: Reviewed  Patient's Medications  New Prescriptions   No medications on file  Previous Medications   ACETAMINOPHEN (TYLENOL) 325 MG TABLET    Take 650 mg by mouth every 8 (eight) hours as needed.   APIXABAN (ELIQUIS) 5 MG TABS TABLET    Take 5 mg by mouth 2 (two) times daily. 10 mg bid until 05/13/15 and 5 mg bid for 11 weeks after   BISACODYL (DULCOLAX) 10 MG SUPPOSITORY    Place 10 mg rectally daily as needed for moderate constipation.   DIAZEPAM (VALIUM) 5 MG TABLET    Take 5 mg by mouth every 6 (six) hours as needed for anxiety.   GABAPENTIN (NEURONTIN) 100 MG CAPSULE    Take 100 mg by mouth 3 (three) times daily.   HYDROCODONE-ACETAMINOPHEN (NORCO/VICODIN) 5-325 MG TABLET    Take 1 tablet by mouth every 4 (four) hours as needed for moderate pain.   LATANOPROST (XALATAN) 0.005 % OPHTHALMIC SOLUTION    Place 1 drop into both eyes.    MULTIPLE VITAMIN (MULTIVITAMIN WITH MINERALS) TABS TABLET    Take 1 tablet by mouth daily.   POLYETHYLENE GLYCOL (MIRALAX / GLYCOLAX) PACKET    Take 17 g by mouth daily.  Modified Medications   No medications on file  Discontinued Medications   ACETAMINOPHEN (  TYLENOL) 325 MG TABLET    Take 650 mg by mouth every 4 (four) hours as needed for moderate pain.   ACETAMINOPHEN (TYLENOL) 500 MG TABLET    Give one tablet by mouth every 6 hours as needed for pain/fever     Allergies  Allergen Reactions  . Codeine Nausea And Vomiting  . Morphine And Related Nausea And Vomiting     REVIEW OF SYSTEMS:  GENERAL: no change in appetite, no fatigue, no weight changes, no fever, chills or weakness EYES: Denies change in vision, dry eyes, eye pain, itching or discharge EARS: Denies change in hearing, ringing in ears, or earache NOSE: Denies nasal congestion or epistaxis MOUTH and THROAT: Denies oral discomfort,  gingival pain or bleeding, pain from teeth or hoarseness   RESPIRATORY: no cough, SOB, DOE, wheezing, hemoptysis CARDIAC: no chest pain, edema or palpitations GI: no abdominal pain, diarrhea, constipation, heart burn, nausea or vomiting GU: Denies dysuria, frequency, hematuria, incontinence, or discharge PSYCHIATRIC: Denies feeling of depression or anxiety. No report of hallucinations, insomnia, paranoia, or agitation   PHYSICAL EXAMINATION  GENERAL APPEARANCE: Well nourished. In no acute distress. Normal body habitus HEAD: Normal in size and contour. No evidence of trauma EYES: Lids open and close normally. No blepharitis, entropion or ectropion. PERRL. Conjunctivae are clear and sclerae are white. Lenses are without opacity EARS: Pinnae are normal. Patient hears normal voice tunes of the examiner MOUTH and THROAT: Lips are without lesions. Oral mucosa is moist and without lesions. Tongue is normal in shape, size, and color and without lesions NECK: supple, trachea midline, no neck masses, no thyroid tenderness, no thyromegaly LYMPHATICS: no LAN in the neck, no supraclavicular LAN RESPIRATORY: breathing is even & unlabored, BS CTAB CARDIAC: RRR, no murmur,no extra heart sounds, no edema GI: abdomen soft, normal BS, no masses, no tenderness, no hepatomegaly, no splenomegaly EXTREMITIES: Able to move 4 extremities PSYCHIATRIC: Alert and oriented X 3. Affect and behavior are appropriate  LABS/RADIOLOGY: Labs reviewed: 05/20/15  WBC 9.0 hemoglobin 13.1 hematocrit 40.1 MCV 91.3 platelets 322 sodium 143 potassium 3.7 glucose 83 BUN 22 creatinine 0.74 calcium 8.8 total protein 5.2 albumin 3.17 globulin 2.0 alkaline phosphatase 127 SGOT 14 SGPT 22    ASSESSMENT/PLAN:  Generalized weakness - for home health PT, OT, CNA and Nursing  Left acetabular nondisplaced fracture - continue Norco 5/325 mg 1 tab by mouth every 4 hours when necessary and acetaminophen 325 mg 2 tabs = 650 mg by mouth every  8 hours for pain; Valium 5 mg 1 tab by mouth every 6 hours when necessary for muscle spasm; 50% weight bearing on LLE; follow-up with orthopedics  Low back pain - continue Norco 5/325 mg 1 tab by mouth every 4 hours when necessary and Tylenol 325 mg 2 tabs = 650 mg by mouth every 8 hours when necessary for pain  LLE DVT - continue Eliquis 5 mg 1 tab by mouth twice a day till 08/03/15  Constipation - continue senna S2 tabs by mouth daily at bedtime, MiraLAX 17 g by mouth daily and Dulcolax 10 mg suppository 1 rectally daily when necessary   Impaired renal function - result; creatinine 0.74  Neuropathy - continue gabapentin 100 mg by mouth 3 times a day      I have filled out patient's discharge paperwork and written prescriptions.  Patient will receive home health PT, OT, skilled Nurse and CNA.  DME provided:  Bedside commode and rolling walker  Total discharge time: Greater than 30 minutes  Discharge time involved coordination of the discharge process with social worker, nursing staff and therapy department. Medical justification for home health services/DME verified.      [facility], NP [employer] [phone]

## 2015-06-01 DIAGNOSIS — I82402 Acute embolism and thrombosis of unspecified deep veins of left lower extremity: Secondary | ICD-10-CM | POA: Diagnosis not present

## 2015-06-01 DIAGNOSIS — M8448XD Pathological fracture, other site, subsequent encounter for fracture with routine healing: Secondary | ICD-10-CM | POA: Diagnosis not present

## 2015-06-01 DIAGNOSIS — M792 Neuralgia and neuritis, unspecified: Secondary | ICD-10-CM | POA: Diagnosis not present

## 2015-06-01 DIAGNOSIS — M84454D Pathological fracture, pelvis, subsequent encounter for fracture with routine healing: Secondary | ICD-10-CM | POA: Diagnosis not present

## 2015-06-01 DIAGNOSIS — M5126 Other intervertebral disc displacement, lumbar region: Secondary | ICD-10-CM | POA: Diagnosis not present

## 2015-06-01 DIAGNOSIS — M4806 Spinal stenosis, lumbar region: Secondary | ICD-10-CM | POA: Diagnosis not present

## 2015-10-11 ENCOUNTER — Other Ambulatory Visit (HOSPITAL_COMMUNITY)
Admission: RE | Admit: 2015-10-11 | Discharge: 2015-10-11 | Disposition: A | Discharge status: Home / Self Care | Payer: Medicare Other | Source: Ambulatory Visit | Attending: Gynecology | Admitting: Gynecology

## 2015-10-11 ENCOUNTER — Encounter: Payer: Self-pay | Admitting: Gynecology

## 2015-10-11 ENCOUNTER — Ambulatory Visit: Payer: Medicare Other | Attending: Gynecology | Admitting: Gynecology

## 2015-10-11 VITALS — BP 179/89 | HR 70 | Temp 97.7°F | Resp 18 | Ht 63.0 in | Wt 128.4 lb

## 2015-10-11 DIAGNOSIS — Y838 Other surgical procedures as the cause of abnormal reaction of the patient, or of later complication, without mention of misadventure at the time of the procedure: Secondary | ICD-10-CM | POA: Insufficient documentation

## 2015-10-11 DIAGNOSIS — D649 Anemia, unspecified: Secondary | ICD-10-CM | POA: Insufficient documentation

## 2015-10-11 DIAGNOSIS — Z923 Personal history of irradiation: Secondary | ICD-10-CM | POA: Diagnosis not present

## 2015-10-11 DIAGNOSIS — Z124 Encounter for screening for malignant neoplasm of cervix: Secondary | ICD-10-CM | POA: Diagnosis present

## 2015-10-11 DIAGNOSIS — C541 Malignant neoplasm of endometrium: Secondary | ICD-10-CM | POA: Diagnosis not present

## 2015-10-11 DIAGNOSIS — M5430 Sciatica, unspecified side: Secondary | ICD-10-CM | POA: Diagnosis not present

## 2015-10-11 DIAGNOSIS — Z87891 Personal history of nicotine dependence: Secondary | ICD-10-CM | POA: Diagnosis not present

## 2015-10-11 DIAGNOSIS — Z8542 Personal history of malignant neoplasm of other parts of uterus: Secondary | ICD-10-CM
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
## 2015-10-11 NOTE — Addendum Note (Signed)
Addended by: Elizebeth Koller on: 10/11/2015 03:37 PM   Modules accepted: Orders

## 2015-10-11 NOTE — Patient Instructions (Signed)
We will contact you with the results of your pap smear from today.  Plan to follow up with Dr. Sondra Come in December 2017 and Dr. Fermin Schwab in June 2018.  Please call [phone] after you see Dr. Sondra Come to schedule your appointment.  Please call for any questions or concerns.

## 2015-10-16 ENCOUNTER — Telehealth: Payer: Self-pay

## 2015-10-16 LAB — CYTOLOGY - PAP

## 2015-10-16 NOTE — Telephone Encounter (Signed)
Orders received from [facility] to contact the patient to updated with PAP results being "normal" collected on 10/11/2015 . Patient contacted and updated with results , patient states understanding ,denies further questions or concerns at this time.

## 2016-04-16 ENCOUNTER — Telehealth: Payer: Self-pay | Admitting: Oncology

## 2016-04-16 ENCOUNTER — Ambulatory Visit
Admission: RE | Admit: 2016-04-16 | Discharge: 2016-04-16 | Disposition: A | Discharge status: Home / Self Care | Payer: Medicare Other | Source: Ambulatory Visit | Attending: Radiation Oncology | Admitting: Radiation Oncology

## 2016-04-16 NOTE — Telephone Encounter (Signed)
Called Jasmine Cooper about her appointment for follow up today.  She said she had called three days ago and canceled the appointment.  She is going out of town and will call back to make an appointment in January.

## 2016-08-23 IMAGING — CR DG LUMBAR SPINE COMPLETE 4+V
5 series · 5 of 5 positions shown · non-contrast
Comparison: Abdominal CT 03/16/2013

CLINICAL DATA: Increased low back pain for 2 months beginning after
a long walk.

EXAM:
LUMBAR SPINE - COMPLETE 4+ VIEW

[w lumbar spine ap]
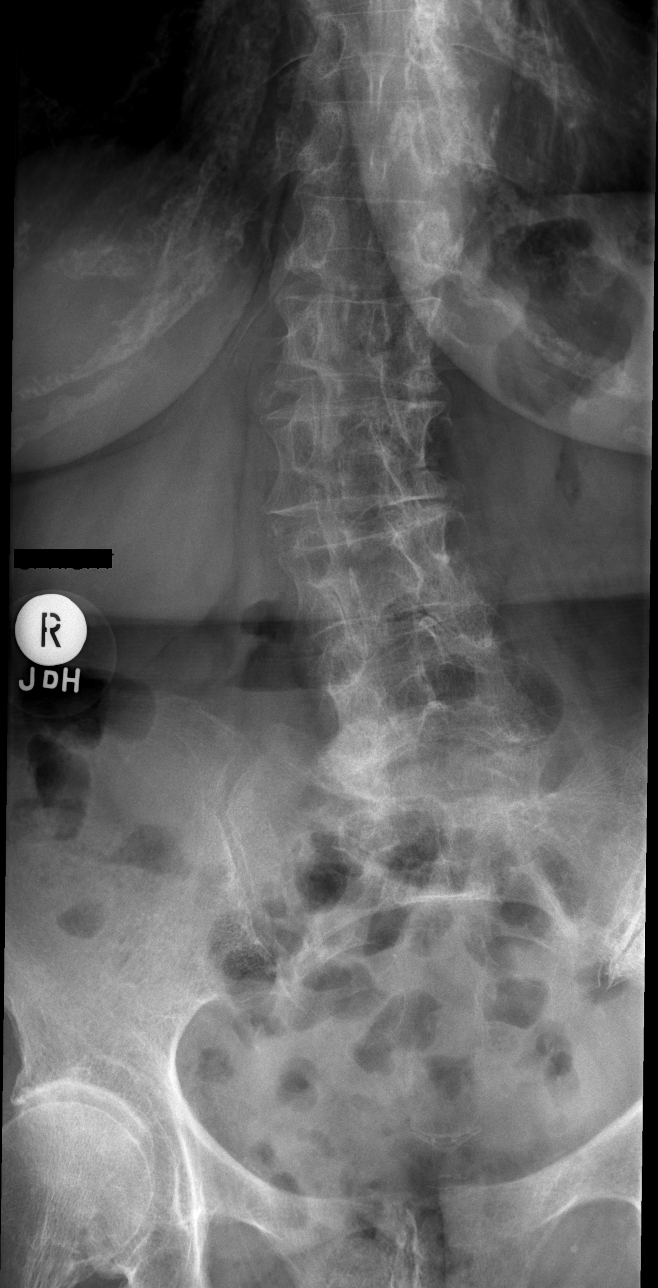

[w lumbar spine obl (1 of 2)]
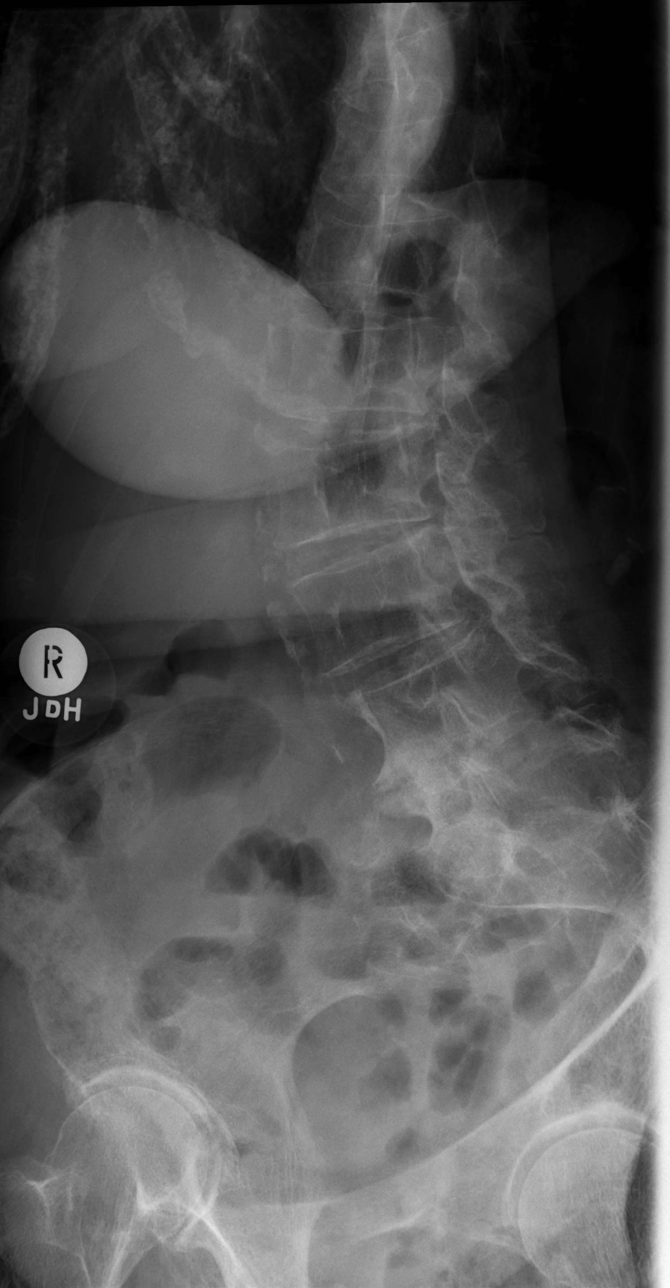

[w lumbar spine obl (2 of 2)]
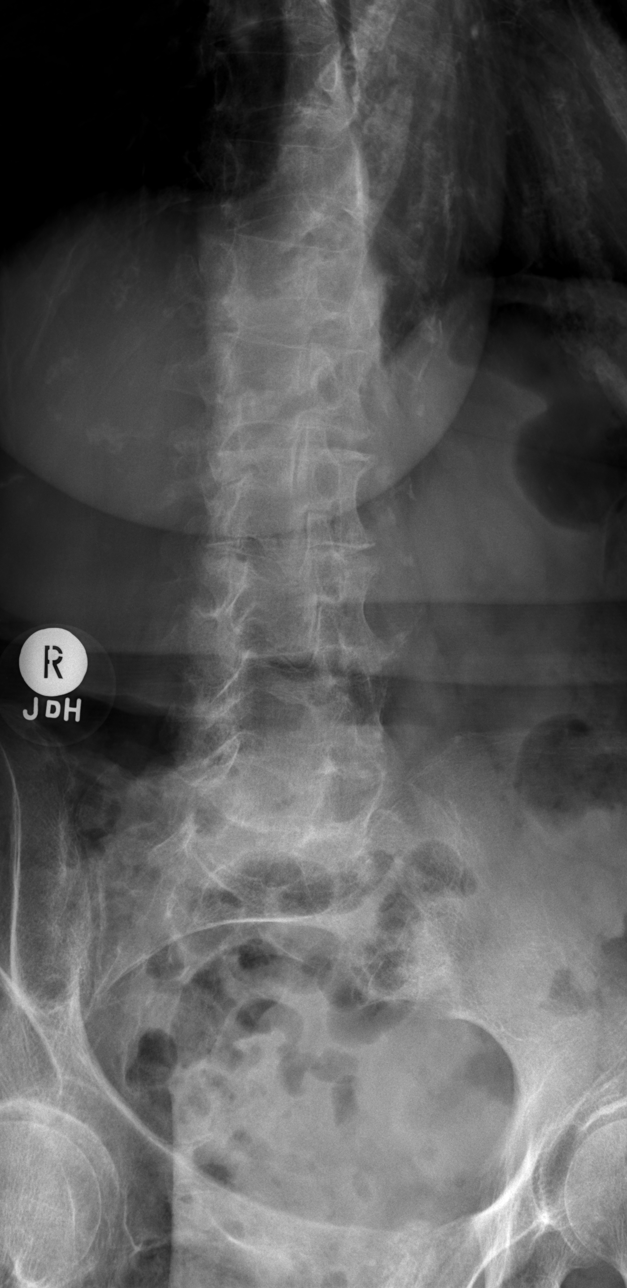

[w lumbar spine lat]
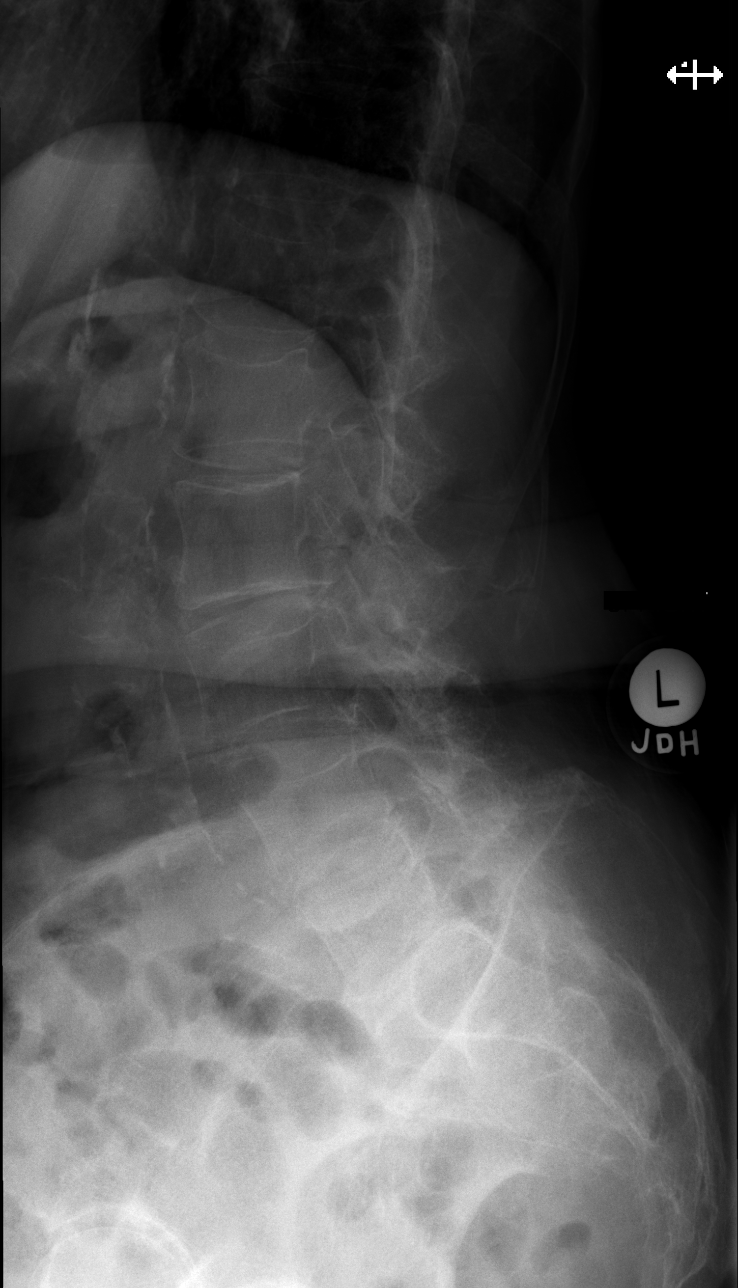

[w lumbar l-5 s-1 spot]
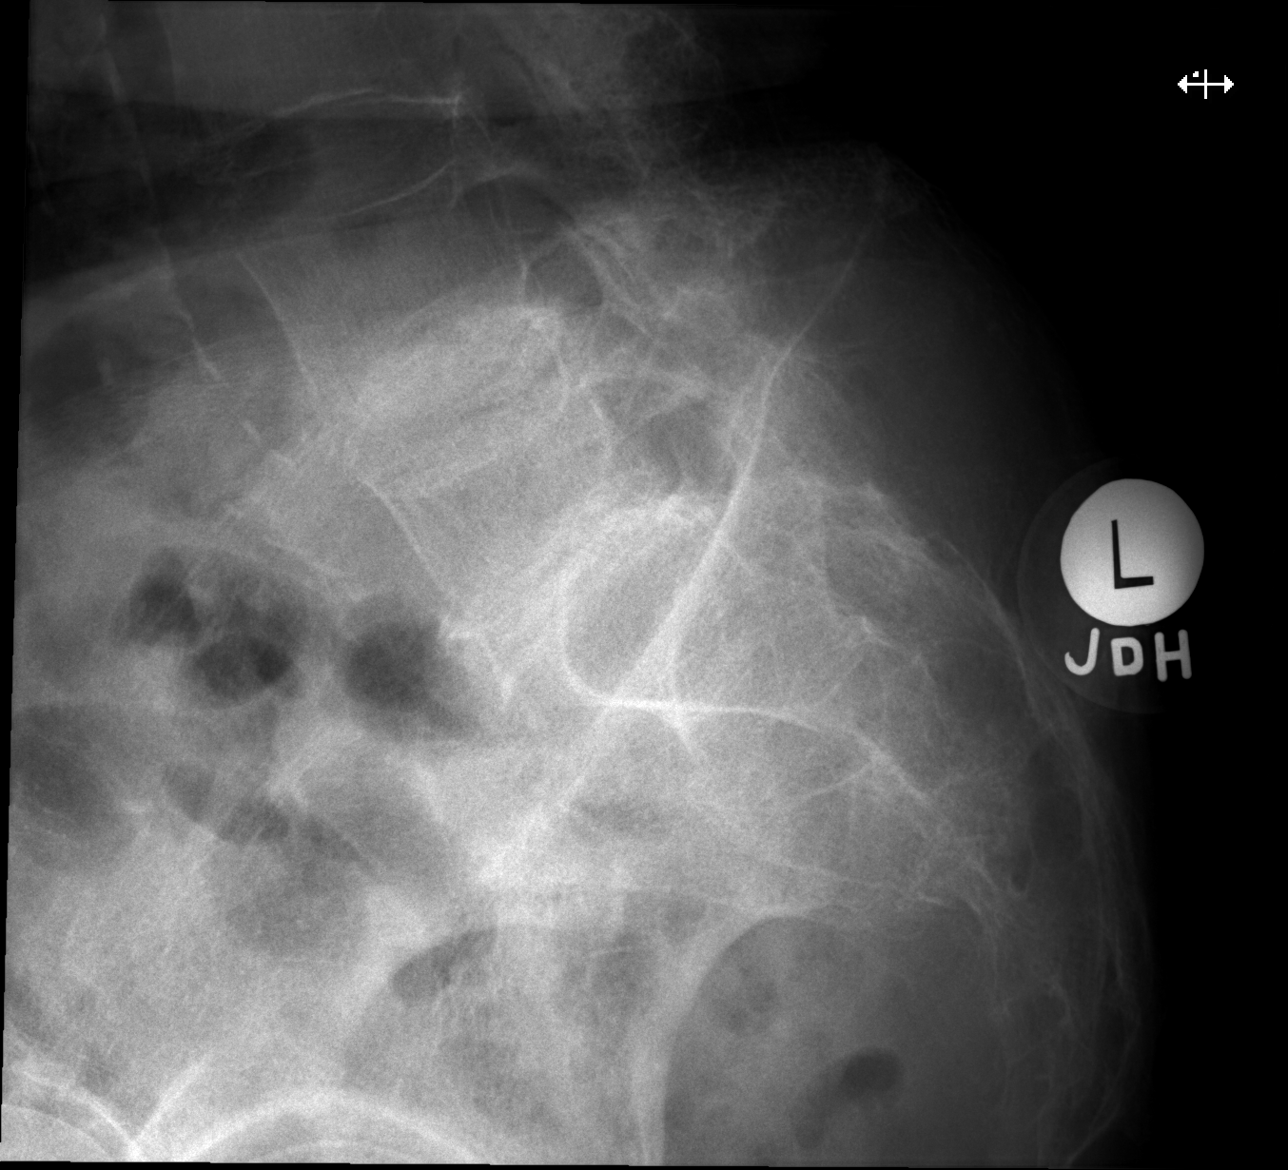

[5 of 5 positions shown; findings below may reference images not displayed]

FINDINGS: There is no evidence of fracture, acute malalignment, or endplate
erosion. No visualized focal bone lesion.

Moderate lumbar dextroscoliosis with diffuse disc narrowing,
greatest and advanced at L4-5. Facet arthropathy with bilateral
moderate spurring.
IMPRESSION: 1. No acute finding.
2. Lumbar spine dextroscoliosis and degeneration with similar
appearance to 0384 CT.

## 2018-10-25 ENCOUNTER — Telehealth: Payer: Self-pay | Admitting: Internal Medicine

## 2018-10-25 NOTE — Telephone Encounter (Signed)
Called patient back as requested and have scheduled a Telephone Palliative consult for 10/31/18 @ 11 AM

## 2018-10-25 NOTE — Telephone Encounter (Signed)
Talked with patient and attempted to schedule the Palliative Consult and patient stated that the home health RN had just arrived and requested that I call back later.  Will follow-up.

## 2018-10-31 ENCOUNTER — Other Ambulatory Visit: Payer: Self-pay

## 2018-10-31 ENCOUNTER — Other Ambulatory Visit: Payer: Medicare Other | Admitting: Internal Medicine

## 2018-10-31 DIAGNOSIS — Z515 Encounter for palliative care: Secondary | ICD-10-CM

## 2018-10-31 NOTE — Progress Notes (Addendum)
June 22nd, 2020 [facility] Palliative Care Consult Note Telephone: [phone]  Fax: [phone]  *Due to the current COVID-19 infection/crises, the patient and family prefer, and have given their verbal consent for, a provider visit via telehealth, from my office. HIPPA policies of confidentially were discussed.  PATIENT NAME: Jasmine Cooper DOB: 07/09/1929 MRN: [id number]  [address], [address], [place].  (Home):  [phone]. Email: [email or web address]  PRIMARY CARE / REFERRING PROVIDER:    Mindi Curling, PA-C [address],  [place] [address]  REFERRING PROVIDER:  Mindi Curling, PA-C  RESPONSIBLE PARTY: (son) Wille Glaser [phone] lives 1 mile from patient's home. No sure if comfortable with toileting. Has a flexible schedule. Retired as is 24. [place]-I-L not as able to be there. (dtr) Barth Kirks [phone]. (dtr) Dereck Ligas [phone]. (son) in [place] [phone]  ASSESSMENT:     Juanda Chance - 10/24/2018 1:20 PM EDT Formatting of this note might be different from the original. Patient ID: Jasmine Cooper is a 85 y.o. (DOB 10/12/1929) femal  RECOMMENDATIONS and PLAN:  1.Cognitive / Functional decline: Patient is A & O x 3. She discusses her situation with a good knowledge base and insight. She is occasionally awakened from sleep d/t pain or dyspnea. She sleeps with HOB elevated. She feels deconditioned and has dyspnea with even minimal activity (dressing) or am. Has home PT/OT.  Her pulse oximetry readings are never less than 95%, despite symptoms. Dyspnea resolves within a min of stopping activity. She had improvement in her fatigue and somnolence when Toprol dose was adjusted from 25 bid to 12.5 bid. She had some hematuria (recent UTI/bladder ca/anticoagulants) which has improved; now only occasional blood tinged urine. She self-catharizes tid. Occasional constipation managed  with prunes, stool softner, or MiraLax. She has L>R LE pain (hips to toes) describes as a waxing/waning shooting pain which travels about various spots. Also, constant back pain (DDD/compression fractures) managed on Buprenorphine patch. Patch dosage was recently increased to 15 mcg (from 46mcg). She had past nausea when trialed on other opioids. Her pain is managed to point where she can participate in her activities. She has neuropathy L foot; very little feeling (gabapentin). She has mild LE swelling L>R.  -recommended upper bed rail/"bed cane" (discussed with daughter where to obtain/how to install) to assist patient with transfers from bed.   -we discussed the importance of patient regularly preforming the exercises outlined by Physical therapist.  2. Family Supports / Coping setting of serious illness: Patient has been widowed for a number of years. She lives in own condo and had been independent till this latest hospitalization. Her two daughters have been tag-teaming to provide her 24/7 care. But daughter Pamala Hurry, who is currently with patient, will be leaving, this weekend. Pamala Hurry is trying to make home care arrangements for pt. Pamala Hurry would like to know from oncologist what the progression of patient's bladder cancer might be, anticipated symptoms, and prognosis so that the family might better be able to make plans for short/long term care. Pamala Hurry is wondering if further immunotherapy would just slow or halt the progression or is there chance for improvement. What is the significance of the bony metastasis? Patient mentions her bladder cancer diagnosis was only 6 months ago. She had 4 treatments with immunotherapy. Follow up scan showed some slight improvement, but there is now bony metastasis. Patient's understanding is that the condition is  still treatable. Patient copes by putting herself in the hands of her health care providers to guide her, and then doesn't worry much about it thereafter.   -I discussed with Pamala Hurry resources for in home care; benefits/burdens private hire. Pamala Hurry plans to utilize patient chart messaging service to jot down questions regarding prognosis, to patient's oncologist.  3. Advanced Care Directive: Plan to f/u on MOST form after patient hears back from oncologist regarding prognosis/potential treatment options. I e-mailed to daughter Pamala Hurry patient education guide to the MOST form. We can complete nxt telehealth visit.  4. Goals of Care: Patient hoping to have the long term support she needs, so that she hopefully can remain in her condo.  5. Follow up Palliative Care Visit: Friday July 3rd at [place] visit with other daughter Dereck Ligas), as Pamala Hurry will likely have traveled back to her home state.   I spent 60 minutes providing this consultation, from11am to 12pm. More than 50% of the time in this consultation was spent coordinating communication.   HISTORY OF PRESENT ILLNESS:  Jasmine Cooper WNIOEVOJJ is a 85 y.o. female with h/o metastatic (pelvic bone) bladder cancer (TURBT Jan 2020; not a candidate for radiation. Pt refused cystectomy. Keytruda on hold d/t cardiac issues), systolic CHF, CKD stage 3, and multifactorial anemia. She is s/p hospitalization [facility]) 5/29-10/15/18) where she was treated for CHF, new onset atrial fibrillation (CV but did not maintain NSR; Toprol/amiodarone), and bilateral DVTs (Eliquis). She has h/o UTIs, anemia, arthritis, bulging lumbar disc, osteoporosis (pathologic fracture). Palliative Care was asked to help address goals of care.  CODE STATUS: Full  PPS: 40% HOSPICE ELIGIBILITY/DIAGNOSIS: TBD   PAST MEDICAL HISTORY:  Past Medical History:  Diagnosis Date  . Anemia 1960's  . Cancer ([place])    endometerial squamous cell  . Complication of anesthesia    very sensitive to medication. hard to wake after anes several  yrs ago.  Marland Kitchen History of radiation therapy 03/30/13-05/08/13   45 Gy to pelvis    SOCIAL HX:   Social History   Tobacco Use  . Smoking status: Former Smoker    Packs/day: 1.00    Years: 30.00    Pack years: 30.00    Types: Cigarettes    Quit date: 05/11/1980    Years since quitting: 38.5  . Smokeless tobacco: Never Used  Substance Use Topics  . Alcohol use: Yes    Alcohol/week: 1.0 standard drinks    Types: 1 Glasses of wine per week    Comment: rarely    ALLERGIES:  Allergies  Allergen Reactions  . Codeine Nausea And Vomiting  . Morphine And Related Nausea And Vomiting       Outpatient Encounter Medications as of 10/31/2018  Medication Sig  . amiodarone (PACERONE) 200 MG tablet Take 200 mg by mouth 2 (two) times daily.  Marland Kitchen apixaban (ELIQUIS) 2.5 MG TABS tablet Take 2.5 mg by mouth 2 (two) times daily.   . Buprenorphine 15 MCG/HR PTWK Place 15 mcg/hr onto the skin once a week.  . Calcium-Magnesium-Vitamin D (CALCIUM 500 PO) Take 500 mg by mouth every morning.  . cefdinir (OMNICEF) 300 MG capsule Take 300 mg by mouth 2 (two) times daily.  . cholecalciferol (VITAMIN D3) 25 MCG (1000 UT) tablet Take 1,000 Units by mouth daily.  . furosemide (LASIX) 20 MG tablet Take 20 mg by mouth daily.  Marland Kitchen gabapentin (NEURONTIN) 100 MG capsule Take 100 mg by mouth 2 (two) times daily. One cap (100mg ) q am, and two  caps (200mg ) q hs  . latanoprost (XALATAN) 0.005 % ophthalmic solution Place 1 drop into both eyes.   . Metoprolol Succinate 25 MG CS24 Take 12.5 mg by mouth 2 (two) times daily.  . Multiple Vitamin (MULTIVITAMIN WITH MINERALS) TABS tablet Take 1 tablet by mouth daily.  . ondansetron (ZOFRAN) 4 MG tablet Take 4 mg by mouth every 8 (eight) hours as needed for nausea or vomiting.  . Probiotic Product (PROBIOTIC-10 PO) Take 1 tablet by mouth daily.  Marland Kitchen denosumab (PROLIA) 60 MG/ML SOLN injection Inject 60 mg into the skin every 6 (six) months. Administer in upper arm, thigh, or abdomen   No facility-administered encounter medications on file as of 10/31/2018.     PHYSICAL EXAM:    Pleasantly conversant, frail appearing, slender. She is not dyspneic with conversation Limited exam d/t telephonic nature of visit. Exposed skin: no rashes Neurological: Generalized weakness but otherwise non-focal.  Julianne Handler, NP

## 2018-11-02 ENCOUNTER — Encounter: Payer: Self-pay | Admitting: Internal Medicine

## 2018-11-11 ENCOUNTER — Telehealth: Payer: Self-pay | Admitting: Internal Medicine

## 2018-11-11 NOTE — Telephone Encounter (Signed)
11am:   Received TC from patients' daughter Dereck Ligas [phone]) who was calling to cancel our scheduled visit for 11am today. Fraser Din shared that patient was now under Hospice services ([place]). I will sign off.  Violeta Gelinas NP-C [phone]

## 2018-12-10 DEATH — deceased
